# Patient Record
Sex: Female | Born: 2000 | Race: Black or African American | Hispanic: No | Marital: Single | Smoking: Former smoker
Health system: Southern US, Community
[De-identification: ages and names within clinical notes are randomized; demographics above are authoritative.]

## PROBLEM LIST (undated history)

## (undated) DIAGNOSIS — F32A Depression, unspecified: Secondary | ICD-10-CM

## (undated) DIAGNOSIS — F329 Major depressive disorder, single episode, unspecified: Secondary | ICD-10-CM

## (undated) DIAGNOSIS — F419 Anxiety disorder, unspecified: Secondary | ICD-10-CM

## (undated) DIAGNOSIS — O039 Complete or unspecified spontaneous abortion without complication: Secondary | ICD-10-CM

## (undated) DIAGNOSIS — F319 Bipolar disorder, unspecified: Secondary | ICD-10-CM

## (undated) DIAGNOSIS — J45909 Unspecified asthma, uncomplicated: Secondary | ICD-10-CM

## (undated) DIAGNOSIS — M419 Scoliosis, unspecified: Secondary | ICD-10-CM

---
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
## 2019-01-28 ENCOUNTER — Emergency Department (HOSPITAL_BASED_OUTPATIENT_CLINIC_OR_DEPARTMENT_OTHER)
Admission: EM | Admit: 2019-01-28 | Discharge: 2019-01-28 | Disposition: A | Discharge status: Home / Self Care | Attending: Emergency Medicine | Admitting: Emergency Medicine

## 2019-01-28 ENCOUNTER — Emergency Department (HOSPITAL_BASED_OUTPATIENT_CLINIC_OR_DEPARTMENT_OTHER)

## 2019-01-28 ENCOUNTER — Other Ambulatory Visit: Payer: Self-pay

## 2019-01-28 ENCOUNTER — Encounter (HOSPITAL_BASED_OUTPATIENT_CLINIC_OR_DEPARTMENT_OTHER): Payer: Self-pay | Admitting: Emergency Medicine

## 2019-01-28 DIAGNOSIS — F17200 Nicotine dependence, unspecified, uncomplicated: Secondary | ICD-10-CM | POA: Diagnosis not present

## 2019-01-28 DIAGNOSIS — O209 Hemorrhage in early pregnancy, unspecified: Secondary | ICD-10-CM | POA: Diagnosis not present

## 2019-01-28 DIAGNOSIS — O99331 Smoking (tobacco) complicating pregnancy, first trimester: Secondary | ICD-10-CM | POA: Insufficient documentation

## 2019-01-28 DIAGNOSIS — O219 Vomiting of pregnancy, unspecified: Secondary | ICD-10-CM | POA: Insufficient documentation

## 2019-01-28 DIAGNOSIS — Z3A01 Less than 8 weeks gestation of pregnancy: Secondary | ICD-10-CM | POA: Insufficient documentation

## 2019-01-28 DIAGNOSIS — O200 Threatened abortion: Secondary | ICD-10-CM

## 2019-01-28 DIAGNOSIS — Z8759 Personal history of other complications of pregnancy, childbirth and the puerperium: Secondary | ICD-10-CM | POA: Insufficient documentation

## 2019-01-28 DIAGNOSIS — J45909 Unspecified asthma, uncomplicated: Secondary | ICD-10-CM | POA: Insufficient documentation

## 2019-01-28 DIAGNOSIS — O26891 Other specified pregnancy related conditions, first trimester: Secondary | ICD-10-CM | POA: Diagnosis not present

## 2019-01-28 DIAGNOSIS — R1030 Lower abdominal pain, unspecified: Secondary | ICD-10-CM | POA: Diagnosis not present

## 2019-01-28 HISTORY — DX: Scoliosis, unspecified: M41.9

## 2019-01-28 HISTORY — DX: Anxiety disorder, unspecified: F41.9

## 2019-01-28 HISTORY — DX: Complete or unspecified spontaneous abortion without complication: O03.9

## 2019-01-28 HISTORY — DX: Bipolar disorder, unspecified: F31.9

## 2019-01-28 HISTORY — DX: Unspecified asthma, uncomplicated: J45.909

## 2019-01-28 HISTORY — DX: Depression, unspecified: F32.A

## 2019-01-28 LAB — WET PREP, GENITAL
Clue Cells Wet Prep HPF POC: NONE SEEN
Sperm: NONE SEEN
Trich, Wet Prep: NONE SEEN
Yeast Wet Prep HPF POC: NONE SEEN

## 2019-01-28 LAB — CBC WITH DIFFERENTIAL/PLATELET
Abs Immature Granulocytes: 0.02 10*3/uL (ref 0.00–0.07)
Basophils Absolute: 0 10*3/uL (ref 0.0–0.1)
Basophils Relative: 1 %
Eosinophils Absolute: 0.4 10*3/uL (ref 0.0–0.5)
Eosinophils Relative: 6 %
HCT: 37.6 % (ref 36.0–46.0)
Hemoglobin: 12 g/dL (ref 12.0–15.0)
Immature Granulocytes: 0 %
Lymphocytes Relative: 30 %
Lymphs Abs: 2.1 10*3/uL (ref 0.7–4.0)
MCH: 30.5 pg (ref 26.0–34.0)
MCHC: 31.9 g/dL (ref 30.0–36.0)
MCV: 95.4 fL (ref 80.0–100.0)
Monocytes Absolute: 0.5 10*3/uL (ref 0.1–1.0)
Monocytes Relative: 7 %
Neutro Abs: 3.8 10*3/uL (ref 1.7–7.7)
Neutrophils Relative %: 56 %
Platelets: 281 10*3/uL (ref 150–400)
RBC: 3.94 MIL/uL (ref 3.87–5.11)
RDW: 12.4 % (ref 11.5–15.5)
WBC: 6.8 10*3/uL (ref 4.0–10.5)
nRBC: 0 % (ref 0.0–0.2)

## 2019-01-28 LAB — COMPREHENSIVE METABOLIC PANEL
ALT: 12 U/L (ref 0–44)
AST: 13 U/L — ABNORMAL LOW (ref 15–41)
Albumin: 3.7 g/dL (ref 3.5–5.0)
Alkaline Phosphatase: 54 U/L (ref 38–126)
Anion gap: 8 (ref 5–15)
BUN: 14 mg/dL (ref 6–20)
CO2: 24 mmol/L (ref 22–32)
Calcium: 8.7 mg/dL — ABNORMAL LOW (ref 8.9–10.3)
Chloride: 103 mmol/L (ref 98–111)
Creatinine, Ser: 0.7 mg/dL (ref 0.44–1.00)
GFR calc Af Amer: >60 mL/min (ref >60)
GFR calc non Af Amer: >60 mL/min (ref >60)
Glucose, Bld: 88 mg/dL (ref 70–99)
Potassium: 3.8 mmol/L (ref 3.5–5.1)
Sodium: 135 mmol/L (ref 135–145)
Total Bilirubin: 0.5 mg/dL (ref 0.3–1.2)
Total Protein: 6.9 g/dL (ref 6.5–8.1)

## 2019-01-28 LAB — URINALYSIS, ROUTINE W REFLEX MICROSCOPIC
Bilirubin Urine: NEGATIVE
Glucose, UA: NEGATIVE mg/dL
Hgb urine dipstick: NEGATIVE
Ketones, ur: NEGATIVE mg/dL
Leukocytes,Ua: NEGATIVE
Nitrite: NEGATIVE
Protein, ur: NEGATIVE mg/dL
Specific Gravity, Urine: 1.02 (ref 1.005–1.030)
pH: 7.5 (ref 5.0–8.0)

## 2019-01-28 LAB — LIPASE, BLOOD: Lipase: 45 U/L (ref 11–51)

## 2019-01-28 LAB — PREGNANCY, URINE: Preg Test, Ur: POSITIVE — AB

## 2019-01-28 LAB — HCG, QUANTITATIVE, PREGNANCY: hCG, Beta Chain, Quant, S: 29109 m[IU]/mL — ABNORMAL HIGH (ref <5)

## 2019-01-28 NOTE — ED Provider Notes (Signed)
[facility] EMERGENCY DEPARTMENT Provider Note   CSN: [id number] Arrival date & time: 01/28/19  1827    History   Chief Complaint Chief Complaint  Patient presents with  . Abdominal Cramping    HPI Kristina Bean is a 18 y.o. female [id number] who presents to the ED today complaining of gradual onset, constant, achy/cramping, diffuse lower abdominal pain x 1 week. Pt states that she took 2 at home pregnancy tests last week which were positive. Pt reports the pain worsened last night and she began having nausea and vomiting. During episodes of severe pain she felt chilly but denies subjective fevers. When she woke up this morning she noticed vaginal spotting and was concerned given hx of miscarriage in the past. Pt has not been taking anything for the pain as she forgot what she could take during pregnancy. She also endorses urinary frequency and diarrhea. Denies pelvic pain, vaginal discharge, flank pain, or any other associated symptoms.        Past Medical History:  Diagnosis Date  . Anxiety   . Asthma   . Bipolar 1 disorder (HCC)   . Depression   . Miscarriage   . Scoliosis     There are no active problems to display for this patient.   History reviewed. No pertinent surgical history.   OB History    Gravida  1   Para      Term      Preterm      AB      Living        SAB      TAB      Ectopic      Multiple      Live Births               Home Medications    Prior to Admission medications   Not on File    Family History No family history on file.  Social History Social History   Tobacco Use  . Smoking status: Current Some Day Smoker  . Smokeless tobacco: Former NeurosurgeonUser  . Tobacco comment: 1 cigarette every few weeks  Substance Use Topics  . Alcohol use: Never    Frequency: Never  . Drug use: Never     Allergies   Patient has no known allergies.   Review of Systems Review of Systems  Constitutional: Positive for chills.  Negative for fever.  HENT: Negative for congestion.   Eyes: Negative for visual disturbance.  Respiratory: Negative for cough and shortness of breath.   Cardiovascular: Negative for chest pain.  Gastrointestinal: Positive for abdominal pain, diarrhea, nausea and vomiting. Negative for constipation.  Genitourinary: Positive for frequency and vaginal bleeding. Negative for dysuria, flank pain, vaginal discharge and vaginal pain.  Musculoskeletal: Negative for myalgias.  Skin: Negative for rash.  Neurological: Negative for syncope and headaches.     Physical Exam Updated Vital Signs BP 124/73 (BP Location: Right Arm)   Pulse 80   Temp 98.4 F (36.9 C) (Oral)   Resp 16   Ht 5\' 7"  (1.702 m)   Wt 68 kg   LMP 12/15/2018   SpO2 97%   BMI 23.49 kg/m   Physical Exam Vitals signs and nursing note reviewed.  Constitutional:      Appearance: She is not ill-appearing.  HENT:     Head: Normocephalic and atraumatic.  Eyes:     Conjunctiva/sclera: Conjunctivae normal.  Neck:     Musculoskeletal: Neck supple.  Cardiovascular:  Rate and Rhythm: Normal rate and regular rhythm.  Pulmonary:     Effort: Pulmonary effort is normal.     Breath sounds: Normal breath sounds.  Abdominal:     Palpations: Abdomen is soft.     Tenderness: There is abdominal tenderness. There is no guarding or rebound.     Comments: Soft, diffuse tenderness to lower abdomen, +BS throughout, no r/g/r, neg murphy's, neg mcburney's, no CVA TTP   Genitourinary:    Comments: Chaperone present for exam. No rashes, lesions, or tenderness to external genitalia. No erythema, injury, or tenderness to vaginal mucosa. No vaginal discharge or bleeding within vaginal vault. No adnexal masses, tenderness, or fullness. No CMT, cervical friability, or discharge from cervical os. Cervical os is closed. Uterus non-deviated, mobile, nonTTP, and without enlargement.   Skin:    General: Skin is warm and dry.  Neurological:      Mental Status: She is alert.      ED Treatments / Results  Labs (all labs ordered are listed, but only abnormal results are displayed) Labs Reviewed  WET PREP, GENITAL - Abnormal; Notable for the following components:      Result Value   WBC, Wet Prep HPF POC MODERATE (*)    All other components within normal limits  PREGNANCY, URINE - Abnormal; Notable for the following components:   Preg Test, Ur POSITIVE (*)    All other components within normal limits  COMPREHENSIVE METABOLIC PANEL - Abnormal; Notable for the following components:   Calcium 8.7 (*)    AST 13 (*)    All other components within normal limits  HCG, QUANTITATIVE, PREGNANCY - Abnormal; Notable for the following components:   hCG, Beta Chain, Quant, S 29,109 (*)    All other components within normal limits  URINALYSIS, ROUTINE W REFLEX MICROSCOPIC  LIPASE, BLOOD  CBC WITH DIFFERENTIAL/PLATELET  RPR  HIV ANTIBODY (ROUTINE TESTING W REFLEX)  ABO/RH  GC/CHLAMYDIA PROBE AMP ([place]) NOT AT [facility]    EKG None  Radiology US Ob Comp < 14 Wks  Result Date: 01/28/2019 CLINICAL DATA:  Vaginal spotting. Intermittent cramping for the past week. Estimated gestational age of [redacted] weeks, 2 days. EXAM: OBSTETRIC <14 WK [place] AND TRANSVAGINAL OB US TECHNIQUE: Both transabdominal and transvaginal ultrasound examinations were performed for complete evaluation of the gestation as well as the maternal uterus, adnexal regions, and pelvic cul-de-sac. Transvaginal technique was performed to assess early pregnancy. COMPARISON:  None. FINDINGS: Intrauterine gestational sac: Single Yolk sac:  Visualized. Embryo:  Not Visualized. MSD: 12.1 mm   6 w   0  d Subchorionic hemorrhage:  None visualized. Maternal uterus/adnexae: Unremarkable.  Right corpus luteum. Small free fluid in the pelvis, likely physiologic. IMPRESSION: 1. Early intrauterine gestational sac with a yolk sac, but no fetal pole or cardiac activity yet visualized. Recommend  follow-up quantitative B-HCG levels and follow-up US in 14 days to assess viability. This recommendation follows SRU consensus guidelines: Diagnostic Criteria for Nonviable Pregnancy Early in the First Trimester. [place] Cor[place] Med 2013; 361:4431-54. Electronically Signed   By: Titus Dubin M.D.   On: 01/28/2019 20:19   US Ob Transvaginal  Result Date: 01/28/2019 CLINICAL DATA:  Vaginal spotting. Intermittent cramping for the past week. Estimated gestational age of [redacted] weeks, 2 days. EXAM: OBSTETRIC <14 WK [place] AND TRANSVAGINAL OB US TECHNIQUE: Both transabdominal and transvaginal ultrasound examinations were performed for complete evaluation of the gestation as well as the maternal uterus, adnexal regions, and pelvic cul-de-sac. Transvaginal  technique was performed to assess early pregnancy. COMPARISON:  None. FINDINGS: Intrauterine gestational sac: Single Yolk sac:  Visualized. Embryo:  Not Visualized. MSD: 12.1 mm   6 w   0  d Subchorionic hemorrhage:  None visualized. Maternal uterus/adnexae: Unremarkable.  Right corpus luteum. Small free fluid in the pelvis, likely physiologic. IMPRESSION: 1. Early intrauterine gestational sac with a yolk sac, but no fetal pole or cardiac activity yet visualized. Recommend follow-up quantitative B-HCG levels and follow-up US in 14 days to assess viability. This recommendation follows SRU consensus guidelines: Diagnostic Criteria for Nonviable Pregnancy Early in the First Trimester. Malva Limes Engl J Med 2013; 161:0960-45; 369:1443-51. Electronically Signed   By: Obie DredgeWilliam T Derry M.D.   On: 01/28/2019 20:19    Procedures Procedures (including critical care time)  Medications Ordered in ED Medications - No data to display   Initial Impression / Assessment and Plan / ED Course  I have reviewed the triage vital signs and the nursing notes.  Pertinent labs & imaging results that were available during my care of the patient were reviewed by me and considered in my medical decision making (see  chart for details).    18 year old female with 2 positive at home preg tests last week who presents with abd pain, nausea, vomiting, diarrhea, and vaginal spotting. Hx of miscarriage in the past. She has diffuse lower abdominal ttp without rebound or guarding. Pt afebrile in the ED today without tachycardia or tachypnea. Concern for ectopic pregnancy giving vaginal spotting and hx of miscarriage in the past. Will obtain pelvic ultrasound today as well as perform pelvic exam. Baseline bloodwork also obtained given abd pain with N/V/D. This could be due to pregnancy or likely more so related to gastroenteritis. Pt reports she ate chicken salad from [employer] but someone else in her household ate it without symptoms. No obvious RLQ abdominal tenderness on exam; very low suspicion for acute abdomen at this time but will reassess once lab work returns.   Urine preg positive. Formal quant with reading 29,000. Remainder of bloodwork reassuring including negative wet prep and U/A. Ultrasound with 6 weeks 0 day intrauterine pregnancy without fetal heart tone. Recommend repeat ultrasound in 2 weeks to assess for viability. [facility] as she is new to the area and does not have an OBGYN. Advised to follow up this week for repeat beta hcg as well to ensure doubling. Pt is in agreement with plan at this time and stable for discharge home.        Final Clinical Impressions(s) / ED Diagnoses   Final diagnoses:  Abdominal pain during pregnancy in first trimester  Threatened abortion    ED Discharge Orders    None       Tanda RockersVenter, Aydrien Froman, PA-C 01/28/19 2138    Geoffery Lyonselo, [place], MD 01/28/19 2141

## 2019-01-28 NOTE — ED Triage Notes (Signed)
Positive pregnancy last week. LMP June 8th.  Cramping and spotting occasionally since last week for consistently for past 3 days.

## 2019-01-28 NOTE — Discharge Instructions (Signed)
You were seen in the ED today for abdominal pain Found to be pregnant at [redacted] weeks 0 days Your ultrasound showed an intrauterine pregnancy without fetal heart tone  It is recommended that you get a repeat ultrasound in 2 weeks to assess for fetal heart tone Please follow up with [facility]; they have walk in hours. You will need to call to find out when their walk in hours are. They will need to trend your beta hcg level to ensure it is doubling as expected.

## 2019-01-29 LAB — ABO/RH: ABO/RH(D): B POS

## 2019-01-30 LAB — GC/CHLAMYDIA PROBE AMP (~~LOC~~) NOT AT ARMC
Chlamydia: NEGATIVE
Neisseria Gonorrhea: NEGATIVE

## 2019-01-30 LAB — HIV ANTIBODY (ROUTINE TESTING W REFLEX): HIV Screen 4th Generation wRfx: NONREACTIVE

## 2019-01-30 LAB — SYPHILIS: RPR W/REFLEX TO RPR TITER AND TREPONEMAL ANTIBODIES, TRADITIONAL SCREENING AND DIAGNOSIS ALGORITHM: RPR Ser Ql: NONREACTIVE

## 2019-02-04 ENCOUNTER — Other Ambulatory Visit: Payer: Self-pay

## 2019-02-04 ENCOUNTER — Observation Stay (HOSPITAL_COMMUNITY): Admission: AD | Admit: 2019-02-04 | Source: Home / Self Care | Admitting: Psychiatry

## 2019-02-04 ENCOUNTER — Other Ambulatory Visit: Payer: Self-pay | Admitting: Behavioral Health

## 2019-02-04 ENCOUNTER — Emergency Department (HOSPITAL_BASED_OUTPATIENT_CLINIC_OR_DEPARTMENT_OTHER)
Admission: EM | Admit: 2019-02-04 | Discharge: 2019-02-04 | Disposition: A | Discharge status: Home / Self Care | Attending: Emergency Medicine | Admitting: Emergency Medicine

## 2019-02-04 ENCOUNTER — Emergency Department (HOSPITAL_BASED_OUTPATIENT_CLINIC_OR_DEPARTMENT_OTHER)

## 2019-02-04 DIAGNOSIS — F329 Major depressive disorder, single episode, unspecified: Secondary | ICD-10-CM

## 2019-02-04 DIAGNOSIS — F17210 Nicotine dependence, cigarettes, uncomplicated: Secondary | ICD-10-CM | POA: Diagnosis not present

## 2019-02-04 DIAGNOSIS — F32A Depression, unspecified: Secondary | ICD-10-CM

## 2019-02-04 DIAGNOSIS — F419 Anxiety disorder, unspecified: Secondary | ICD-10-CM | POA: Diagnosis not present

## 2019-02-04 DIAGNOSIS — O99341 Other mental disorders complicating pregnancy, first trimester: Secondary | ICD-10-CM | POA: Diagnosis present

## 2019-02-04 DIAGNOSIS — Z3A01 Less than 8 weeks gestation of pregnancy: Secondary | ICD-10-CM | POA: Diagnosis not present

## 2019-02-04 DIAGNOSIS — Z3A08 8 weeks gestation of pregnancy: Secondary | ICD-10-CM

## 2019-02-04 DIAGNOSIS — O200 Threatened abortion: Secondary | ICD-10-CM

## 2019-02-04 DIAGNOSIS — O3680X0 Pregnancy with inconclusive fetal viability, not applicable or unspecified: Secondary | ICD-10-CM

## 2019-02-04 NOTE — ED Notes (Signed)
[place] call in progress with patient

## 2019-02-04 NOTE — ED Notes (Signed)
Pt provided snack at this time. 

## 2019-02-04 NOTE — ED Notes (Signed)
[place] provided resource information to [facility]. Information provided to pt and educated. Pt voiced understanding and is ok with this plan.

## 2019-02-04 NOTE — ED Notes (Signed)
ED Provider at bedside. 

## 2019-02-04 NOTE — ED Notes (Signed)
Pt voiced feeling safe to go home- pt informed RN she would call family services today to make an appt. Pt encouraged to come back or call [facility] hotline if needed.

## 2019-02-04 NOTE — ED Notes (Signed)
Spoke with Crystal at [facility]. Psych eval cart at bedside for consult.

## 2019-02-04 NOTE — ED Notes (Signed)
Patient transported to US 

## 2019-02-04 NOTE — ED Notes (Signed)
Received call from [place] at [facility] regarding what she needs to get this pt transferred there for overnight Obs. 1 - Covid Test.  2 - EDP signed medical clearance. 3 - Address the pregnancy note from 1 week ago that said "No FHT".  Dr. Stark Jock notified.  Orders being placed.

## 2019-02-04 NOTE — Progress Notes (Signed)
Spoke with patient's RN, Orlando Penner, who notes that patient is reluctant to be brought over to the [facility] Observation unit as her partner is unable to get her car and she was really looking for outpatient services.  From the record, it does not appear that patient would meet any criteria for being IVC'd.  CSW advised that the decision to d/c patient was the ED Provider's to make.    CSW entered Follow-up provider information for [facility] [place] into patient's discharge instructions and notified ED RN of same.  Areatha Keas. Judi Cong, MSW, [place] Disposition Clinical Social Work [phone] (cell) [phone] (office)
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
## 2019-02-04 NOTE — Progress Notes (Signed)
Pt accepted to [facility] Observation unit, Bed 206-1  Kristina Maes, NP is the accepting provider.  Kristina Abbot, MD is the attending provider.  Call report to [phone]  @ [facility] ED notified.   Pt is Voluntary.  Pt may be transported by [facility]  Pt scheduled  to arrive at [facility] Observation unit once patient has a resulted COVID-19 test and is medically cleared by ED Provider, and transport can be arranged.  Kristina Bean. Judi Cong, MSW, [place] Disposition Clinical Social Work [phone] (cell) [phone] (office)

## 2019-02-04 NOTE — ED Triage Notes (Signed)
Pt c/o anxiety and panic attacks. Pt verbalizes hx of anxiety. Pt tearful during triage.

## 2019-02-04 NOTE — ED Provider Notes (Signed)
[facility] EMERGENCY DEPARTMENT Provider Note   CSN: [id number] Arrival date & time: 02/04/19  [id number]     History   Chief Complaint Chief Complaint  Patient presents with  . Anxiety    HPI Emireth Fayrene FearingJames is a 18 y.o. female.     Patient is a 18 year old female with history of anxiety, depression, bipolar.  She is currently pregnant in the first trimester.  She presents today for evaluation of anxiety, depression, tearfulness.  Patient describes being pregnant and having little support from her boyfriend.  She tells me that he is encouraging her to have a miscarriage which she does not want to have.  Patient also reports multiple stressors including finances and education contributing to her visit today.  She tells me that she has had some suicidal ideation, however would never act on these impulses due to her young daughter needing her.  Patient is requesting to speak with a psychiatrist.  Patient has been in this area for approximately 2 months.  She relocated here from [place].  Patient states that she has no other support in her life.  Her mother died from an apparent homicide several years ago and she is no longer on speaking terms with her father.  The history is provided by the patient.  Anxiety This is a recurrent problem. The current episode started 2 days ago. The problem occurs constantly. The problem has been rapidly worsening. Associated symptoms include chest pain. Nothing aggravates the symptoms. Nothing relieves the symptoms. She has tried nothing for the symptoms.    Past Medical History:  Diagnosis Date  . Anxiety   . Asthma   . Bipolar 1 disorder (HCC)   . Depression   . Miscarriage   . Scoliosis     There are no active problems to display for this patient.   No past surgical history on file.   OB History    Gravida  1   Para      Term      Preterm      AB      Living        SAB      TAB      Ectopic      Multiple      Live  Births               Home Medications    Prior to Admission medications   Not on File    Family History No family history on file.  Social History Social History   Tobacco Use  . Smoking status: Current Some Day Smoker  . Smokeless tobacco: Former NeurosurgeonUser  . Tobacco comment: 1 cigarette every few weeks  Substance Use Topics  . Alcohol use: Never    Frequency: Never  . Drug use: Never     Allergies   Patient has no known allergies.   Review of Systems Review of Systems  Cardiovascular: Positive for chest pain.  All other systems reviewed and are negative.    Physical Exam Updated Vital Signs BP (!) 146/92 (BP Location: Right Arm)   Pulse 92   Temp 98.3 F (36.8 C) (Oral)   Resp 20   Ht 5\' 7"  (1.702 m)   Wt 68 kg   LMP 12/15/2018   SpO2 96%   BMI 23.49 kg/m   Physical Exam Vitals signs and nursing note reviewed.  Constitutional:      General: She is not in acute distress.    Appearance:  She is well-developed. She is not diaphoretic.  HENT:     Head: Normocephalic and atraumatic.  Neck:     Musculoskeletal: Normal range of motion and neck supple.  Cardiovascular:     Rate and Rhythm: Normal rate and regular rhythm.     Heart sounds: No murmur. No friction rub. No gallop.   Pulmonary:     Effort: Pulmonary effort is normal. No respiratory distress.     Breath sounds: Normal breath sounds. No wheezing.  Abdominal:     General: Bowel sounds are normal. There is no distension.     Palpations: Abdomen is soft.     Tenderness: There is no abdominal tenderness.  Musculoskeletal: Normal range of motion.  Skin:    General: Skin is warm and dry.  Neurological:     Mental Status: She is alert and oriented to person, place, and time.  Psychiatric:        Attention and Perception: Attention and perception normal.        Mood and Affect: Mood is anxious. Affect is tearful.        Speech: Speech is rapid and pressured.        Thought Content: Thought  content does not include homicidal or suicidal ideation. Thought content does not include homicidal or suicidal plan.        Cognition and Memory: Cognition normal.        Judgment: Judgment normal.      ED Treatments / Results  Labs (all labs ordered are listed, but only abnormal results are displayed) Labs Reviewed - No data to display  EKG None  Radiology No results found.  Procedures Procedures (including critical care time)  Medications Ordered in ED Medications - No data to display   Initial Impression / Assessment and Plan / ED Course  I have reviewed the triage vital signs and the nursing notes.  Pertinent labs & imaging results that were available during my care of the patient were reviewed by me and considered in my medical decision making (see chart for details).  Patient is a 18 year old female presenting with complaints of anxiety and panic attacks.  Patient has excessive stressors in her life including caring for her young daughter and being pregnant.  She tells me that her significant other is unsupportive of this pregnancy and encouraging her to have an abortion.  She is very tearful and upset about this.  Patient requested to talk to a counselor.  She was put in touch with [facility] who has spoken with the patient.  They were offering overnight observation.  Patient was initially receptive to this, however as her ER stay went on, she realized she had too many responsibilities at home with her daughter and significant other.  She ultimately declined this offer and was discharged at her request.  Patient was not suicidal or homicidal and was not hallucinating.  She has decision-making capacity.  Patient's medical work-up was essentially unremarkable.  Her ultrasound performed today reveals a viable intrauterine pregnancy.  Final Clinical Impressions(s) / ED Diagnoses   Final diagnoses:  None    ED Discharge Orders    None       Veryl Speak, MD 02/05/19 [phone]

## 2019-02-04 NOTE — Discharge Instructions (Addendum)
Follow-up with [facility] [place] as recommended by [facility] representatives.  Return to the emergency department if you experience any new and/or concerning symptoms.

## 2019-02-04 NOTE — ED Notes (Signed)
Pt at this time doesn't feel that she can be admitted for overnight observation r/t a young child at home with pts significant other and that family member has no transportation because pt has car with her. Pt states she is from out of town and no other family to help. This RN will provide an update to Romie Minus after EDP provider reassesses patient.

## 2019-02-04 NOTE — ED Notes (Signed)
Crystal at [facility] advises recommendation for pt to be observed overnight. Crystal spoke with pat via telepsych. Pt verbalized understanding and affirms overnight stay. EDP Delo to be notified.

## 2019-04-04 ENCOUNTER — Encounter (HOSPITAL_BASED_OUTPATIENT_CLINIC_OR_DEPARTMENT_OTHER): Payer: Self-pay | Admitting: Emergency Medicine

## 2019-04-04 ENCOUNTER — Emergency Department (HOSPITAL_BASED_OUTPATIENT_CLINIC_OR_DEPARTMENT_OTHER)
Admission: EM | Admit: 2019-04-04 | Discharge: 2019-04-04 | Disposition: A | Discharge status: Home / Self Care | Attending: Emergency Medicine | Admitting: Emergency Medicine

## 2019-04-04 ENCOUNTER — Other Ambulatory Visit: Payer: Self-pay

## 2019-04-04 DIAGNOSIS — N390 Urinary tract infection, site not specified: Secondary | ICD-10-CM | POA: Diagnosis not present

## 2019-04-04 DIAGNOSIS — Z720 Tobacco use: Secondary | ICD-10-CM | POA: Insufficient documentation

## 2019-04-04 DIAGNOSIS — J45909 Unspecified asthma, uncomplicated: Secondary | ICD-10-CM | POA: Diagnosis not present

## 2019-04-04 DIAGNOSIS — J329 Chronic sinusitis, unspecified: Secondary | ICD-10-CM | POA: Diagnosis not present

## 2019-04-04 DIAGNOSIS — R300 Dysuria: Secondary | ICD-10-CM | POA: Diagnosis present

## 2019-04-04 LAB — URINALYSIS, ROUTINE W REFLEX MICROSCOPIC
Bilirubin Urine: NEGATIVE
Glucose, UA: NEGATIVE mg/dL
Ketones, ur: NEGATIVE mg/dL
Nitrite: NEGATIVE
Protein, ur: NEGATIVE mg/dL
Specific Gravity, Urine: 1.025 (ref 1.005–1.030)
pH: 6 (ref 5.0–8.0)

## 2019-04-04 LAB — URINALYSIS, MICROSCOPIC (REFLEX)

## 2019-04-04 LAB — PREGNANCY, URINE: Preg Test, Ur: NEGATIVE

## 2019-04-04 MED ORDER — CEPHALEXIN 500 MG PO CAPS: 500.0000 mg | ORAL_CAPSULE | Freq: Four times a day (QID) | ORAL | 0 refills | Status: AC

## 2019-04-04 NOTE — Discharge Instructions (Signed)
If you develop fever, vomiting, shortness of breath, or any other new/concerning symptoms, then return to the ER

## 2019-04-04 NOTE — ED Provider Notes (Signed)
[facility] EMERGENCY DEPARTMENT Provider Note   CSN: [id number] Arrival date & time: 04/04/19  1531     History   Chief Complaint Chief Complaint  Patient presents with  . Nasal Congestion  . Dysuria    HPI Kristina Bean is a 18 y.o. female.     HPI 18 year old female presents with 2 complaints.  Chief complaint is dysuria and bilateral flank pain concerning for recurrent UTI.  Has had this multiple times.  Typically does not get the back pain.  Started about 6 days ago with the dysuria and frequency and then 5 days of the mild flank pain bilaterally.  Some blood in her urine on and off.  No fevers.  She also complains of sinusitis/sinus congestion for about 6 months.  Has been on 2 different antibiotics.  Has been trying different antihistamine such as Benadryl and Zyrtec as well as Flonase.  Nothing seems to help.  A lot of mucus that runs down her throat and some green and yellow and clear sputum.  No shortness of breath.  No fevers.  Sometimes the mucus will make her throw up but otherwise no vomiting.  No concern for STI or vaginal discharge/bleeding.  Past Medical History:  Diagnosis Date  . Anxiety   . Asthma   . Bipolar 1 disorder ([place])   . Depression   . Miscarriage   . Scoliosis     There are no active problems to display for this patient.   History reviewed. No pertinent surgical history.   OB History    Gravida  1   Para      Term      Preterm      AB      Living        SAB      TAB      Ectopic      Multiple      Live Births               Home Medications    Prior to Admission medications   Medication Sig Start Date End Date Taking? Authorizing Provider  cephALEXin (KEFLEX) 500 MG capsule Take 1 capsule (500 mg total) by mouth 4 (four) times daily for 10 days. 04/04/19 04/14/19  Sherwood Gambler, MD    Family History No family history on file.  Social History Social History   Tobacco Use  . Smoking status: Current  Some Day Smoker  . Smokeless tobacco: Former Systems developer  . Tobacco comment: 1 cigarette every few weeks  Substance Use Topics  . Alcohol use: Never    Frequency: Never  . Drug use: Never     Allergies   Patient has no known allergies.   Review of Systems Review of Systems  Constitutional: Negative for fever.  HENT: Positive for congestion and postnasal drip.   Respiratory: Negative for shortness of breath.   Gastrointestinal: Negative for abdominal pain and vomiting.  Genitourinary: Positive for dysuria, flank pain, frequency, hematuria and urgency. Negative for vaginal bleeding and vaginal discharge.  All other systems reviewed and are negative.    Physical Exam Updated Vital Signs BP (!) 143/97 (BP Location: Left Arm)   Pulse 64   Temp 97.8 F (36.6 C) (Oral)   Resp 18   Ht 5\' 7"  (1.702 m)   Wt 68 kg   LMP 04/04/2019   SpO2 100%   Breastfeeding Unknown   BMI 23.49 kg/m   Physical Exam Vitals signs and nursing note  reviewed.  Constitutional:      Appearance: She is well-developed.  HENT:     Head: Normocephalic and atraumatic.     Right Ear: External ear normal.     Left Ear: External ear normal.     Nose: Nose normal.     Mouth/Throat:     Mouth: Mucous membranes are moist.     Pharynx: No oropharyngeal exudate or posterior oropharyngeal erythema.  Eyes:     General:        Right eye: No discharge.        Left eye: No discharge.  Cardiovascular:     Rate and Rhythm: Normal rate and regular rhythm.     Heart sounds: Normal heart sounds.  Pulmonary:     Effort: Pulmonary effort is normal.     Breath sounds: Normal breath sounds.  Abdominal:     Palpations: Abdomen is soft.     Tenderness: There is no abdominal tenderness. There is right CVA tenderness and left CVA tenderness.     Comments: Mild bilateral flank tenderness  Skin:    General: Skin is warm and dry.  Neurological:     Mental Status: She is alert.  Psychiatric:        Mood and Affect: Mood  is not anxious.      ED Treatments / Results  Labs (all labs ordered are listed, but only abnormal results are displayed) Labs Reviewed  URINALYSIS, ROUTINE W REFLEX MICROSCOPIC - Abnormal; Notable for the following components:      Result Value   APPearance CLOUDY (*)    Hgb urine dipstick SMALL (*)    Leukocytes,Ua TRACE (*)    All other components within normal limits  URINALYSIS, MICROSCOPIC (REFLEX) - Abnormal; Notable for the following components:   Bacteria, UA MANY (*)    All other components within normal limits  URINE CULTURE  PREGNANCY, URINE    EKG None  Radiology No results found.  Procedures Procedures (including critical care time)  Medications Ordered in ED Medications - No data to display   Initial Impression / Assessment and Plan / ED Course  I have reviewed the triage vital signs and the nursing notes.  Pertinent labs & imaging results that were available during my care of the patient were reviewed by me and considered in my medical decision making (see chart for details).        Per the patient's sinusitis problems, I do not think antibiotics are warranted given the length of time of her symptoms and previous nonresponse to antibiotics along with no fever.  I will refer her to ENT.  As for her urinary symptoms, appears to be a urinary tract infection and with some mild flank pain, will treat as early pyelonephritis.  I will give her Keflex for this and send urine for culture.  Return precautions discussed.  Final Clinical Impressions(s) / ED Diagnoses   Final diagnoses:  Acute urinary tract infection  Chronic sinusitis, unspecified location    ED Discharge Orders         Ordered    cephALEXin (KEFLEX) 500 MG capsule  4 times daily     04/04/19 1801           Pricilla Loveless, MD 04/04/19 Paulo Fruit

## 2019-04-04 NOTE — ED Notes (Signed)
ED Provider at bedside. 

## 2019-04-04 NOTE — ED Triage Notes (Signed)
Patient states that she has had a sinus infection since March - she reports that she continues to have problems with her breathing and nasal congestion after multiple treatments. The patient also reports that 2 days ago she started to have burning with urination, flank pain and bladder spasms

## 2019-04-07 LAB — URINE CULTURE

## 2020-06-04 ENCOUNTER — Telehealth: Admitting: Emergency Medicine

## 2020-06-04 DIAGNOSIS — R300 Dysuria: Secondary | ICD-10-CM | POA: Diagnosis not present

## 2020-06-04 MED ORDER — NITROFURANTOIN MONOHYD MACRO 100 MG PO CAPS: 100.0000 mg | ORAL_CAPSULE | Freq: Two times a day (BID) | ORAL | 0 refills | Status: DC

## 2020-06-04 NOTE — Progress Notes (Signed)
We are sorry that you are not feeling well.  Here is how we plan to help!  Based on what you shared with me it looks like you most likely have a simple urinary tract infection.  A UTI (Urinary Tract Infection) is a bacterial infection of the bladder.  Most cases of urinary tract infections are simple to treat but a key part of your care is to encourage you to drink plenty of fluids and watch your symptoms carefully.  I have prescribed MacroBid 100 mg twice a day for 5 days.  Your symptoms should gradually improve. Call us if the burning in your urine worsens, you develop worsening fever, back pain or pelvic pain or if your symptoms do not resolve after completing the antibiotic.  Urinary tract infections can be prevented by drinking plenty of water to keep your body hydrated.  Also be sure when you wipe, wipe from front to back and don't hold it in!  If possible, empty your bladder every 4 hours.  Your e-visit answers were reviewed by a board certified advanced clinical practitioner to complete your personal care plan.  Depending on the condition, your plan could have included both over the counter or prescription medications.  If there is a problem please reply  once you have received a response from your provider.  Your safety is important to us.  If you have drug allergies check your prescription carefully.    You can use MyChart to ask questions about today's visit, request a non-urgent call back, or ask for a work or school excuse for 24 hours related to this e-Visit. If it has been greater than 24 hours you will need to follow up with your provider, or enter a new e-Visit to address those concerns.   You will get an e-mail in the next two days asking about your experience.  I hope that your e-visit has been valuable and will speed your recovery. Thank you for using e-visits.   **Please do not respond to this message unless you have follow up questions.** Greater than 5 but less than 10  minutes spent researching, coordinating, and implementing care for this patient today  

## 2020-06-11 ENCOUNTER — Telehealth: Admitting: Nurse Practitioner

## 2020-06-11 DIAGNOSIS — R399 Unspecified symptoms and signs involving the genitourinary system: Secondary | ICD-10-CM

## 2020-06-11 NOTE — Progress Notes (Signed)
Based on what you shared with me it looks like you have unresolved UTI. ,that should be evaluated in a face to face office visit. Because previous treatment did not completely tae care of your symptoms, you need a urinalysis and urine culture for proper treament.    NOTE: If you entered your credit card information for this eVisit, you will not be charged. You may see a "hold" on your card for the $35 but that hold will drop off and you will not have a charge processed.  If you are having a true medical emergency please call 911.     For an urgent face to face visit, [facility] has four urgent care centers for your convenience:   . [facility]    [phone]                  Get Driving Directions  [address] [place], [place] [address] . 10 am to 8 pm Monday-Friday . 12 pm to 8 pm Saturday-Sunday   . [facility] at [facility]  [phone]                  Get Driving Directions  [address] [place], [place] [address] . 8 am to 8 pm Monday-Friday . 9 am to 6 pm Saturday . 11 am to 6 pm Sunday   . [facility] at [facility]  [phone]                  Get Driving Directions   [address] [place], [place] [address] . 8 am to 8 pm Monday-Friday . 8 am to 4 pm Saturday-Sunday    . [facility] at [facility] Directions  [phone]  [address] [place], [place] [address]  . Monday-Friday, 12 PM to 6 PM    Your e-visit answers were reviewed by a board certified advanced clinical practitioner to complete your personal care plan.  Thank you for using e-Visits.

## 2020-06-26 ENCOUNTER — Emergency Department (HOSPITAL_COMMUNITY)
Admission: EM | Admit: 2020-06-26 | Discharge: 2020-06-26 | Disposition: A | Discharge status: Home / Self Care | Attending: Emergency Medicine | Admitting: Emergency Medicine

## 2020-06-26 ENCOUNTER — Other Ambulatory Visit: Payer: Self-pay

## 2020-06-26 ENCOUNTER — Emergency Department (HOSPITAL_COMMUNITY)

## 2020-06-26 ENCOUNTER — Encounter (HOSPITAL_COMMUNITY): Payer: Self-pay | Admitting: Emergency Medicine

## 2020-06-26 DIAGNOSIS — M545 Low back pain, unspecified: Secondary | ICD-10-CM | POA: Insufficient documentation

## 2020-06-26 DIAGNOSIS — F17200 Nicotine dependence, unspecified, uncomplicated: Secondary | ICD-10-CM | POA: Diagnosis not present

## 2020-06-26 DIAGNOSIS — R200 Anesthesia of skin: Secondary | ICD-10-CM | POA: Insufficient documentation

## 2020-06-26 DIAGNOSIS — J45909 Unspecified asthma, uncomplicated: Secondary | ICD-10-CM | POA: Insufficient documentation

## 2020-06-26 LAB — BASIC METABOLIC PANEL
Anion gap: 11 (ref 5–15)
BUN: 18 mg/dL (ref 6–20)
CO2: 23 mmol/L (ref 22–32)
Calcium: 9.6 mg/dL (ref 8.9–10.3)
Chloride: 106 mmol/L (ref 98–111)
Creatinine, Ser: 0.85 mg/dL (ref 0.44–1.00)
GFR, Estimated: >60 mL/min (ref >60)
Glucose, Bld: 88 mg/dL (ref 70–99)
Potassium: 3.7 mmol/L (ref 3.5–5.1)
Sodium: 140 mmol/L (ref 135–145)

## 2020-06-26 LAB — CBC
HCT: 42 % (ref 36.0–46.0)
Hemoglobin: 13.8 g/dL (ref 12.0–15.0)
MCH: 31.1 pg (ref 26.0–34.0)
MCHC: 32.9 g/dL (ref 30.0–36.0)
MCV: 94.6 fL (ref 80.0–100.0)
Platelets: 293 10*3/uL (ref 150–400)
RBC: 4.44 MIL/uL (ref 3.87–5.11)
RDW: 11.7 % (ref 11.5–15.5)
WBC: 4.5 10*3/uL (ref 4.0–10.5)
nRBC: 0 % (ref 0.0–0.2)

## 2020-06-26 LAB — I-STAT BETA HCG BLOOD, ED (MC, WL, AP ONLY): I-stat hCG, quantitative: <5 m[IU]/mL (ref <5)

## 2020-06-26 LAB — TROPONIN I (HIGH SENSITIVITY)
Troponin I (High Sensitivity): 7 ng/L (ref <18)
Troponin I (High Sensitivity): 6 ng/L (ref <18)

## 2020-06-26 MED ORDER — ACETAMINOPHEN 325 MG PO TABS
650.0000 mg | ORAL_TABLET | Freq: Once | ORAL | Status: AC
  Administered 2020-06-26: 650 mg via ORAL
  Filled 2020-06-26: qty 2

## 2020-06-26 MED ORDER — PREDNISONE 20 MG PO TABS: 20.0000 mg | ORAL_TABLET | Freq: Every day | ORAL | 0 refills | Status: AC

## 2020-06-26 MED ORDER — LIDOCAINE 5 % EX PTCH
1.0000 | MEDICATED_PATCH | Freq: Once | CUTANEOUS | Status: DC
  Administered 2020-06-26: 1 via TRANSDERMAL
  Filled 2020-06-26: qty 1

## 2020-06-26 MED ORDER — PREDNISONE 20 MG PO TABS
60.0000 mg | ORAL_TABLET | Freq: Once | ORAL | Status: AC
  Administered 2020-06-26: 60 mg via ORAL
  Filled 2020-06-26: qty 3

## 2020-06-26 NOTE — ED Provider Notes (Signed)
[facility] EMERGENCY DEPARTMENT Provider Note   CSN: [id number] Arrival date & time: 06/26/20  1127     History Chief Complaint  Patient presents with  . Back Pain  . Shortness of Breath    Kristina Bean is a 19 y.o. female with past medical history significant for anxiety, bipolar 1 disorder, depression, asthma, scoliosis.  HPI Presents to emergency room today with chief complaint of back pain x2 weeks.She states pain is in her middle and low back on the right side. She describes it as a burning and aching pain. Pain does not radiate. She states her pain is worse when trying to do things such as lift her two year old daughter or bend down while giving her a bath. She feels like her back pain is progressively worsening. She states pain is constant. She rates pain 7/10 in severity. She has history of similar pain and has seen a chiropractor in the past. No medications for symptoms prior to arrival.  Patient states she has been having bilateral leg numbness x 1 week. It is intermittent. She states it happens when she is sitting for long periods of time. It feels like pins and needles. Typically she is able to get up and walk and the symptoms improve.  Last night the pain was so bad she was crying. She feels pressure in her legs. She noticed when laying flat yesterday her bilateral great toes were red. She was able to walk although states it was difficult. She also states when she has the leg numbness she will have have intermittent sharp pain across her chest and the pain in her back is worse with inspirations.   Not currently followed for her scoliosis anywhere. She has appointment scheduled with a Spine doctor 07/20/2019. It will be her first appointment at the practice. Denies any sick contacts.Denies fevers, weight loss, numbness/weakness of upper and lower extremities, bowel/bladder incontinence, urinary retention, history of cancer, saddle anesthesia, history of back  surgery, history of IVDA.       Past Medical History:  Diagnosis Date  . Anxiety   . Asthma   . Bipolar 1 disorder (HCC)   . Depression   . Miscarriage   . Scoliosis     There are no problems to display for this patient.   History reviewed. No pertinent surgical history.   OB History    Gravida  1   Para      Term      Preterm      AB      Living        SAB      IAB      Ectopic      Multiple      Live Births              No family history on file.  Social History   Tobacco Use  . Smoking status: Current Some Day Smoker  . Smokeless tobacco: Former Neurosurgeon  . Tobacco comment: 1 cigarette every few weeks  Substance Use Topics  . Alcohol use: Never  . Drug use: Never    Home Medications Prior to Admission medications   Medication Sig Start Date End Date Taking? Authorizing Provider  nitrofurantoin, macrocrystal-monohydrate, (MACROBID) 100 MG capsule Take 1 capsule (100 mg total) by mouth 2 (two) times daily. 06/04/20   Harris, Cammy Copa, PA-C  predniSONE (DELTASONE) 20 MG tablet Take 1 tablet (20 mg total) by mouth daily for 5 days. 06/27/20 07/02/20  Shanon AceWalisiewicz, Claire Dolores E, PA-C    Allergies    Patient has no known allergies.  Review of Systems   Review of Systems All other systems are reviewed and are negative for acute change except as noted in the HPI.  Physical Exam Updated Vital Signs BP 139/87 (BP Location: Left Arm)   Pulse (!) 104   Temp 99.3 F (37.4 C) (Oral)   Resp 12   Ht 5\' 7"  (1.702 m)   Wt 69.4 kg   LMP 06/10/2020   SpO2 100%   BMI 23.96 kg/m   Physical Exam Vitals and nursing note reviewed.  Constitutional:      General: She is not in acute distress.    Appearance: She is not ill-appearing.  HENT:     Head: Normocephalic and atraumatic.     Right Ear: Tympanic membrane and external ear normal.     Left Ear: Tympanic membrane and external ear normal.     Nose: Nose normal.     Mouth/Throat:     Mouth:  Mucous membranes are moist.     Pharynx: Oropharynx is clear.  Eyes:     General: No scleral icterus.       Right eye: No discharge.        Left eye: No discharge.     Extraocular Movements: Extraocular movements intact.     Conjunctiva/sclera: Conjunctivae normal.     Pupils: Pupils are equal, round, and reactive to light.  Neck:     Vascular: No JVD.  Cardiovascular:     Rate and Rhythm: Normal rate and regular rhythm.     Pulses: Normal pulses.          Radial pulses are 2+ on the right side and 2+ on the left side.       Dorsalis pedis pulses are 2+ on the right side and 2+ on the left side.     Heart sounds: Normal heart sounds.     Comments: HR in the 70s during exam Pulmonary:     Comments: Lungs clear to auscultation in all fields. Symmetric chest rise. No wheezing, rales, or rhonchi. Abdominal:     Comments: Abdomen is soft, non-distended, and non-tender in all quadrants. No rigidity, no guarding. No peritoneal signs.  Musculoskeletal:        General: Normal range of motion.     Cervical back: Normal range of motion.       Back:     Comments: Tender to palpation as depicted image above.  No overlying skin changes.  Skin:    General: Skin is warm and dry.     Capillary Refill: Capillary refill takes less than 2 seconds.  Neurological:     Mental Status: She is oriented to person, place, and time.     GCS: GCS eye subscore is 4. GCS verbal subscore is 5. GCS motor subscore is 6.     Comments: Speech is clear and goal oriented, follows commands CN III-XII intact, no facial droop Normal strength in upper and lower extremities bilaterally including dorsiflexion and plantar flexion, strong and equal grip strength No saddle anesthesia. Sensation normal to light and sharp touch Moves extremities without ataxia, coordination intact Normal finger to nose and rapid alternating movements Normal gait and balance   Psychiatric:        Behavior: Behavior normal.     ED  Results / Procedures / Treatments   Labs (all labs ordered are listed, but only abnormal results are displayed) Labs  Reviewed  BASIC METABOLIC PANEL  CBC  I-STAT BETA HCG BLOOD, ED ([facility], [facility], [facility] ONLY)  TROPONIN I (HIGH SENSITIVITY)  TROPONIN I (HIGH SENSITIVITY)    EKG EKG Interpretation  Date/Time:  Sunday June 26 2020 11:53:33 EST Ventricular Rate:  90 PR Interval:  128 QRS Duration: 82 QT Interval:  328 QTC Calculation: 401 R Axis:   79 Text Interpretation: Normal sinus rhythm Normal ECG No old tracing to compare Confirmed by Jacalyn Lefevre [phone]) on 06/26/2020 12:42:48 PM   Radiology DG Chest 2 View  Result Date: 06/26/2020 CLINICAL DATA:  Back pain in a patient with a history of scoliosis. EXAM: CHEST - 2 VIEW COMPARISON:  None. FINDINGS: Lungs clear. Heart size normal. No pneumothorax or pleural effusion. No acute or focal bony abnormality. S shaped thoracolumbar scoliosis is partially imaged. Scoliosis convex to the right in the thoracic spine with the apex at T11. IMPRESSION: No acute disease. Scoliosis. Electronically Signed   By: Drusilla Kanner M.D.   On: 06/26/2020 14:50   DG Thoracic Spine 2 View  Result Date: 06/26/2020 CLINICAL DATA:  Thoracic spine pain.  No known injury. EXAM: THORACIC SPINE 2 VIEWS COMPARISON:  None. FINDINGS: No fracture or malalignment. Intervertebral disc space height is maintained. Convex right scoliosis with the apex at T11 noted. 12 rib-bearing vertebral bodies. No segmentation anomaly. IMPRESSION: Convex right thoracolumbar scoliosis.  Otherwise negative. Electronically Signed   By: Drusilla Kanner M.D.   On: 06/26/2020 14:51   DG Lumbar Spine Complete  Result Date: 06/26/2020 CLINICAL DATA:  Low back and thoracic spine pain. No acute abnormality. EXAM: LUMBAR SPINE - COMPLETE 4+ VIEW COMPARISON:  None. FINDINGS: No fracture or listhesis. Intervertebral disc space height is maintained. Convex right thoracolumbar scoliosis with the  apex at T11 noted. The patient has 5 lumbar type vertebral bodies. No segmentation anomaly. IMPRESSION: Convex right thoracolumbar scoliosis.  Otherwise negative. Electronically Signed   By: Drusilla Kanner M.D.   On: 06/26/2020 14:51    Procedures Procedures (including critical care time)  Medications Ordered in ED Medications  lidocaine (LIDODERM) 5 % 1 patch (1 patch Transdermal Patch Applied 06/26/20 1355)  acetaminophen (TYLENOL) tablet 650 mg (650 mg Oral Given 06/26/20 1355)  predniSONE (DELTASONE) tablet 60 mg (60 mg Oral Given 06/26/20 1611)    ED Course  I have reviewed the triage vital signs and the nursing notes.  Pertinent labs & imaging results that were available during my care of the patient were reviewed by me and considered in my medical decision making (see chart for details).  Vitals:   06/26/20 1148 06/26/20 1449  BP: 139/87 128/69  Pulse: (!) 104 68  Resp: 12 14  Temp: 99.3 F (37.4 C) 98.9 F (37.2 C)  TempSrc: Oral Oral  SpO2: 100% 100%  Weight: 69.4 kg   Height: 5\' 7"  (1.702 m)       MDM Rules/Calculators/A&P                          History provided by patient with additional history obtained from chart review.    19 yo female presenting with back pain and bilateral leg numbness. Afebrile, HDS. Noted to be tachycardic in triage to 104, resolved by the time of my exam however, suspect anxiety related. Patient has multiple complaints of back pain and bilateral leg numbness. Neuro exam is normal. No focal weakness. Ambulatory with normal gait, no numbness of extremities. No red flags for back  pain. Work up initiated in triage. CBC and BMP are unremarkable. Delta troponin negative. Pregnancy test negative. EKG is NSR, no signs of ischemia. Xray of chest, thoracic spine, and lumbar spine ordered. I viewed all images. No acute findings. Radiologist comments on her scoliosis. Agree with radiologist impression.  Patient given tylenol, prednisone, and  lidoderm patch. On reassessment she states pain has significantly improved. She has been in the ED for a total of 4 hours now without any symptoms. Doubt PE, ACS or dissection as cause of her chest pain. Suspect radicular pain or pinched nerve. Recommend she keep follow up appointment with spine specialist next month. Discharged to home with short burst of prednisone and encourage tylenol and ibuprofen if needed. The patient appears reasonably screened and/or stabilized for discharge and I doubt any other medical condition or other [facility] requiring further screening, evaluation, or treatment in the ED at this time prior to discharge. The patient is safe for discharge with strict return precautions discussed.    Portions of this note were generated with Scientist, clinical (histocompatibility and immunogenetics). Dictation errors may occur despite best attempts at proofreading.   Final Clinical Impression(s) / ED Diagnoses Final diagnoses:  Acute right-sided low back pain without sciatica    Rx / DC Orders ED Discharge Orders         Ordered    predniSONE (DELTASONE) 20 MG tablet  Daily        06/26/20 1502           Shanon Ace, PA-C 06/26/20 1942    Jacalyn Lefevre, MD 06/28/20 1505

## 2020-06-26 NOTE — ED Notes (Signed)
Patient Alert and oriented to baseline. Stable and ambulatory to baseline. Patient verbalized understanding of the discharge instructions.  Patient belongings were taken by the patient.   

## 2020-06-26 NOTE — Discharge Instructions (Signed)
All your blood work today was normal. Your EKG was normal. Your x-rays showed your scoliosis that we already knew.   -Recommend you take Tylenol and ibuprofen for your pain.  Take as record on the bottle.  -Prescription sent to your pharmacy for prednisone. It is possible your pain is being caused by a pinched nerve. You already had one today so take the next dose tomorrow.  -You can buy over-the-counter lidocaine patches for your back pain as well.  Keep your appointment with the spine specialist.  Return to the emergency department for any new or worsening symptoms.

## 2020-06-26 NOTE — ED Triage Notes (Signed)
Pt reports burning pain to R mid and upper back x 2 weeks that is gradually getting worse.  Reports bilateral leg numbness, tingling in toes, tingling in bilateral arms and hands, pain across chest, and SOB.  States R sided back pain is sharp and worse with deep inspiration.

## 2020-07-02 ENCOUNTER — Emergency Department (HOSPITAL_COMMUNITY)

## 2020-07-02 ENCOUNTER — Encounter (HOSPITAL_COMMUNITY): Payer: Self-pay

## 2020-07-02 ENCOUNTER — Other Ambulatory Visit: Payer: Self-pay

## 2020-07-02 ENCOUNTER — Inpatient Hospital Stay (HOSPITAL_COMMUNITY)
Admission: EM | Admit: 2020-07-02 | Discharge: 2020-07-08 | DRG: 556 | Disposition: A | Discharge status: Home / Self Care | Attending: Internal Medicine | Admitting: Internal Medicine

## 2020-07-02 DIAGNOSIS — E610 Copper deficiency: Secondary | ICD-10-CM | POA: Diagnosis present

## 2020-07-02 DIAGNOSIS — Z20822 Contact with and (suspected) exposure to covid-19: Secondary | ICD-10-CM | POA: Diagnosis not present

## 2020-07-02 DIAGNOSIS — K5900 Constipation, unspecified: Secondary | ICD-10-CM | POA: Diagnosis present

## 2020-07-02 DIAGNOSIS — Z87440 Personal history of urinary (tract) infections: Secondary | ICD-10-CM | POA: Diagnosis not present

## 2020-07-02 DIAGNOSIS — R251 Tremor, unspecified: Secondary | ICD-10-CM | POA: Diagnosis present

## 2020-07-02 DIAGNOSIS — E876 Hypokalemia: Secondary | ICD-10-CM | POA: Diagnosis not present

## 2020-07-02 DIAGNOSIS — R531 Weakness: Principal | ICD-10-CM

## 2020-07-02 DIAGNOSIS — E559 Vitamin D deficiency, unspecified: Secondary | ICD-10-CM | POA: Diagnosis present

## 2020-07-02 DIAGNOSIS — J45909 Unspecified asthma, uncomplicated: Secondary | ICD-10-CM | POA: Diagnosis present

## 2020-07-02 DIAGNOSIS — F4310 Post-traumatic stress disorder, unspecified: Secondary | ICD-10-CM | POA: Diagnosis present

## 2020-07-02 DIAGNOSIS — Z634 Disappearance and death of family member: Secondary | ICD-10-CM

## 2020-07-02 DIAGNOSIS — Z818 Family history of other mental and behavioral disorders: Secondary | ICD-10-CM | POA: Diagnosis not present

## 2020-07-02 DIAGNOSIS — F419 Anxiety disorder, unspecified: Secondary | ICD-10-CM | POA: Diagnosis not present

## 2020-07-02 DIAGNOSIS — F319 Bipolar disorder, unspecified: Secondary | ICD-10-CM | POA: Diagnosis not present

## 2020-07-02 DIAGNOSIS — R339 Retention of urine, unspecified: Secondary | ICD-10-CM | POA: Diagnosis not present

## 2020-07-02 DIAGNOSIS — Z87891 Personal history of nicotine dependence: Secondary | ICD-10-CM | POA: Diagnosis not present

## 2020-07-02 DIAGNOSIS — R338 Other retention of urine: Secondary | ICD-10-CM | POA: Diagnosis present

## 2020-07-02 DIAGNOSIS — M797 Fibromyalgia: Principal | ICD-10-CM | POA: Diagnosis present

## 2020-07-02 DIAGNOSIS — M419 Scoliosis, unspecified: Secondary | ICD-10-CM | POA: Diagnosis present

## 2020-07-02 DIAGNOSIS — F32A Depression, unspecified: Secondary | ICD-10-CM | POA: Diagnosis present

## 2020-07-02 LAB — CBC WITH DIFFERENTIAL/PLATELET
Abs Immature Granulocytes: 0.03 10*3/uL (ref 0.00–0.07)
Basophils Absolute: 0 10*3/uL (ref 0.0–0.1)
Basophils Relative: 1 %
Eosinophils Absolute: 0 10*3/uL (ref 0.0–0.5)
Eosinophils Relative: 0 %
HCT: 40.4 % (ref 36.0–46.0)
Hemoglobin: 13.9 g/dL (ref 12.0–15.0)
Immature Granulocytes: 0 %
Lymphocytes Relative: 24 %
Lymphs Abs: 2.1 10*3/uL (ref 0.7–4.0)
MCH: 31.7 pg (ref 26.0–34.0)
MCHC: 34.4 g/dL (ref 30.0–36.0)
MCV: 92.2 fL (ref 80.0–100.0)
Monocytes Absolute: 0.5 10*3/uL (ref 0.1–1.0)
Monocytes Relative: 6 %
Neutro Abs: 6.1 10*3/uL (ref 1.7–7.7)
Neutrophils Relative %: 69 %
Platelets: 295 10*3/uL (ref 150–400)
RBC: 4.38 MIL/uL (ref 3.87–5.11)
RDW: 11.6 % (ref 11.5–15.5)
WBC: 8.8 10*3/uL (ref 4.0–10.5)
nRBC: 0 % (ref 0.0–0.2)

## 2020-07-02 LAB — COMPREHENSIVE METABOLIC PANEL
ALT: 13 U/L (ref 0–44)
AST: 13 U/L — ABNORMAL LOW (ref 15–41)
Albumin: 4.3 g/dL (ref 3.5–5.0)
Alkaline Phosphatase: 34 U/L — ABNORMAL LOW (ref 38–126)
Anion gap: 10 (ref 5–15)
BUN: 26 mg/dL — ABNORMAL HIGH (ref 6–20)
CO2: 24 mmol/L (ref 22–32)
Calcium: 9.3 mg/dL (ref 8.9–10.3)
Chloride: 104 mmol/L (ref 98–111)
Creatinine, Ser: 0.95 mg/dL (ref 0.44–1.00)
GFR, Estimated: >60 mL/min (ref >60)
Glucose, Bld: 82 mg/dL (ref 70–99)
Potassium: 3.4 mmol/L — ABNORMAL LOW (ref 3.5–5.1)
Sodium: 138 mmol/L (ref 135–145)
Total Bilirubin: 1.5 mg/dL — ABNORMAL HIGH (ref 0.3–1.2)
Total Protein: 7.7 g/dL (ref 6.5–8.1)

## 2020-07-02 LAB — CK: Total CK: 202 U/L (ref 38–234)

## 2020-07-02 LAB — I-STAT BETA HCG BLOOD, ED (MC, WL, AP ONLY): I-stat hCG, quantitative: <5 m[IU]/mL (ref <5)

## 2020-07-02 LAB — SEDIMENTATION RATE: Sed Rate: 4 mm/hr (ref 0–22)

## 2020-07-02 MED ORDER — HYDROMORPHONE HCL 1 MG/ML IJ SOLN
1.0000 mg | Freq: Once | INTRAMUSCULAR | Status: AC
  Administered 2020-07-02: 1 mg via INTRAVENOUS
  Filled 2020-07-02: qty 1

## 2020-07-02 MED ORDER — MORPHINE SULFATE (PF) 2 MG/ML IV SOLN
2.0000 mg | Freq: Once | INTRAVENOUS | Status: DC
  Filled 2020-07-02: qty 1

## 2020-07-02 MED ORDER — LORAZEPAM 2 MG/ML IJ SOLN
0.5000 mg | Freq: Once | INTRAMUSCULAR | Status: AC
  Administered 2020-07-03: 01:00:00 0.5 mg via INTRAVENOUS
  Filled 2020-07-02: qty 1

## 2020-07-02 MED ORDER — LORAZEPAM 2 MG/ML IJ SOLN
0.5000 mg | Freq: Once | INTRAMUSCULAR | Status: AC
  Administered 2020-07-02: 21:00:00 0.5 mg via INTRAVENOUS
  Filled 2020-07-02: qty 1

## 2020-07-02 MED ORDER — MORPHINE SULFATE (PF) 2 MG/ML IV SOLN: 2.0000 mg | Freq: Once | INTRAVENOUS | Status: DC

## 2020-07-02 MED ORDER — KETOROLAC TROMETHAMINE 30 MG/ML IJ SOLN
30.0000 mg | Freq: Once | INTRAMUSCULAR | Status: AC
  Administered 2020-07-02: 20:00:00 30 mg via INTRAVENOUS
  Filled 2020-07-02: qty 1

## 2020-07-02 MED ORDER — GADOBUTROL 1 MMOL/ML IV SOLN
7.0000 mL | Freq: Once | INTRAVENOUS | Status: AC | PRN
  Administered 2020-07-02: 19:00:00 7 mL via INTRAVENOUS

## 2020-07-02 MED ORDER — FENTANYL CITRATE (PF) 100 MCG/2ML IJ SOLN
50.0000 ug | Freq: Once | INTRAMUSCULAR | Status: AC
  Administered 2020-07-02: 50 ug via INTRAMUSCULAR
  Filled 2020-07-02: qty 2

## 2020-07-02 MED ORDER — HYDROMORPHONE HCL 1 MG/ML IJ SOLN
1.0000 mg | Freq: Once | INTRAMUSCULAR | Status: AC
  Administered 2020-07-02: 21:00:00 1 mg via INTRAVENOUS
  Filled 2020-07-02: qty 1

## 2020-07-02 NOTE — ED Provider Notes (Signed)
  Physical Exam  BP 137/82 (BP Location: Right Arm)   Pulse 89   Temp 99 F (37.2 C) (Oral)   Resp 20   LMP 06/10/2020   SpO2 100%   Physical Exam  ED Course/Procedures     Procedures  MDM   Patient signed out to me by Carma Lair, PA-C.  Please see previous notes for further history.  Brief, patient presenting for evaluation of diffuse back pain preventing ambulation.  She also has numbness/tingling in hands and legs.  She has an intentional tremor of the upper extremities as well as diffuse weakness.  Hyperreflexia, equal bilaterally.  She had negative MRI of the head and neck.  Neuro was consulted, recommended LP.  This was attempted in the ER, but was unsuccessful.  She will need IR LP.  Neuro will evaluate her tonight, however recommends admission for further work-up. Admission call pending.   Discussed with Dr. Allena Katz from [facility], patient to be admitted.   Alveria Apley, PA-C 07/02/20 2343    Paula Libra, MD 07/03/20 [phone]

## 2020-07-02 NOTE — ED Notes (Signed)
Pt states she is in pain and is having anxiety, provider made aware.

## 2020-07-02 NOTE — ED Notes (Signed)
Pt is c/o pain, will medicate.

## 2020-07-02 NOTE — H&P (Addendum)
History and Physical    Krithika Tome EHM:[id number] DOB: 15-Oct-2000 DOA: 07/02/2020  PCP: Patient, No Pcp Per  Patient coming from: Home  I have personally briefly reviewed patient's old medical records in [place]  Chief Complaint: Back pain, weakness in extremities, numbness/tingling of extremities  HPI: Kristina Bean is a 19 y.o. female with medical history significant for asthma, bipolar 1 disorder, PTSD/anxiety/depression, scoliosis who presents to the ED for evaluation of significant weakness in extremities, neuropathic pain, joint pain.  Patient states she was in her usual state of health, ambulating on her own power and able to complete all activities until a few months ago.  She says she began to have UTI symptoms with dysuria, suprapubic, and bilateral flank discomfort.  She was having financial issues therefore did not seek medical assistance until she had a E-visit last month (06/04/2020 per chart).  Given her reported symptoms she was prescribed a 5-day course of nitrofurantoin which she said she completed.  She says after she completed her antibiotics she had a few days of flulike symptoms with fevers.  She was able to manage at home.  She was not tested for flu or Covid during that time.  Over the last 2 weeks she began to develop low back pain, initially right-sided.  Then she began to have shooting pains which felt like never coming from her back.  Initially shooting pains were in her head, then began to occur in her arms and legs.  Pains would vary in which extremities they would occur.  She also had associated numbness/tingling with pins-and-needles sensations.  Last week she began to have significant upper back pain and stiffness.  She felt like she had a burning sensation in her chest.  She was seen in the ED on 06/26/2020 for her back pain.  X-rays of the chest, thoracic spine, and lumbar spine were obtained and showed scoliosis.  Symptoms were felt related to  radicular pain or pinched nerve.  She was discharged to home with short burst of prednisone.  Patient says she had minimal improvement with prednisone.  Since then she has had decreased mobility.  She noticed significant weakness in her hands with new difficulty in grasp.  She said she had difficulty opening packages.  She then began to have weakness and numbness in her legs with feelings of pins/needles sensation in the bottom of her feet.  Next she developed tremors in her hands.  She says 4 days ago she began to have joint pain in her knees, fingers, toes, and pretty much any joint area.  Joint pain would vary in location during his episodes.  She has not seen any swelling of her joints or any obvious skin changes/rashes.  She also reports having TMJ symptoms and some difficulty with opening her jaw at times.  She noticed having a transient episode of right eye pain. She has had a few episodes of diarrhea which she correlates to cheese/dairy products.  She denies receiving any recent vaccinations.  She has not had any obvious insect/tick bites.  She says she had a few scratches from a kitten on her arms which did not appear to become infected.  She denies any recent travel other than to [place], [place] in the past year.  She says she works at home where she lives with her boyfriend and young daughter.  She reports rare vaping use and occasional shots of liquor.  She denies any marijuana, cocaine, IV drug use, methamphetamine use.  She denies  any known drug allergies.  She says her mother is deceased and had a history of thyroid, breast, and colon cancer as well as diabetes and heart disease.  She is not currently taking any medications regularly.  She uses Zofran, Tylenol, ibuprofen as needed.  ED Course:  Initial vitals showed BP 133/90, pulse 88, RR 18, temp 99.8 F, SPO2 98% on room air.  Labs show WBC 8.8, hemoglobin 13.9, platelets 295,000, sodium 138, potassium 3.4, bicarb 24, BUN 26,  creatinine 0.95, serum glucose 82, AST 13, ALT 13, alk phos 34, total bilirubin 1.5, ESR 4, CK 202.  I-STAT beta-hCG <5.0.  SARS-CoV-2 PCR is ordered and pending.  MRI brain and cervical spine are obtained with normal MRI appearance of the brain and cervical spinal cord.  Patient was given IV Dilaudid 1 mg x 2, IM fentanyl 50 mcg once, Toradol 30 mg IV once, Ativan 0.5 mg IV once.  EDP discussed the case with on-call neurology who felt patient requires admission for further work-up including LP.  LP was attempted in the ED but unsuccessful due to patient scoliosis.  IR consulted to perform LP in the morning.  Neurology to formally consult on patient tonight.  Hospitalist service was consulted to admit for further evaluation and management.  Review of Systems: All systems reviewed and are negative except as documented in history of present illness above.   Past Medical History:  Diagnosis Date  . Anxiety   . Asthma   . Bipolar 1 disorder ([place])   . Depression   . Miscarriage   . Scoliosis     History reviewed. No pertinent surgical history.  Social History:  reports that she has been smoking. She has quit using smokeless tobacco. She reports current alcohol use. She reports that she does not use drugs.  No Known Allergies  Family History  Problem Relation Age of Onset  . Breast cancer Mother   . Thyroid cancer Mother   . Colon cancer Mother   . Diabetes Mother   . Heart disease Mother      Prior to Admission medications   Medication Sig Start Date End Date Taking? Authorizing Provider  ibuprofen (ADVIL) 800 MG tablet Take 800 mg by mouth every 8 (eight) hours as needed for moderate pain.   Yes [provider]  nitrofurantoin, macrocrystal-monohydrate, (MACROBID) 100 MG capsule Take 1 capsule (100 mg total) by mouth 2 (two) times daily. Patient not taking: No sig reported 06/04/20   Margarita Mail, PA-C  predniSONE (DELTASONE) 20 MG tablet Take 1 tablet (20 mg total)  by mouth daily for 5 days. 06/27/20 07/02/20  Barrie Folk, PA-C    Physical Exam: Vitals:   07/02/20 1524 07/02/20 1649 07/02/20 1947 07/02/20 2228  BP: 134/64 116/68 136/71 137/82  Pulse: 67 66 91 89  Resp: '18 16 18 20  ' Temp: 98.9 F (37.2 C)  99 F (37.2 C)   TempSrc: Oral  Oral   SpO2: 100% 99% 100% 100%   Constitutional: Resting in bed with head elevated, somewhat anxious and uncomfortable but otherwise calm and appropriate. Eyes: PERRL, EOMI, lids and conjunctivae normal ENMT: Mucous membranes are moist. Posterior pharynx clear of any exudate or lesions.Normal dentition.  Neck: normal, supple, no masses. Respiratory: clear to auscultation bilaterally, no wheezing, no crackles. Normal respiratory effort. No accessory muscle use.  Cardiovascular: Regular rate and rhythm, no murmurs / rubs / gallops. No extremity edema. 2+ pedal pulses. Abdomen: Mild tenderness suprapubic region, no masses palpated. No  hepatosplenomegaly. Bowel sounds positive.  Musculoskeletal: no clubbing / cyanosis. No joint deformity upper and lower extremities.  Generalized weakness all extremities. Skin: no rashes, lesions, ulcers. No induration.  Tattoos left upper extremity. Neurologic: CN 2-12 grossly intact. Sensation intact with hypersensitivity to light touch.  Strength diminished to 0-1/5 all extremities, question due to pain versus weakness. Psychiatric: Normal judgment and insight. Alert and oriented x 3.  Anxious mood.   Labs on Admission: I have personally reviewed following labs and imaging studies  CBC: Recent Labs  Lab 06/26/20 1206 07/02/20 1410  WBC 4.5 8.8  NEUTROABS  --  6.1  HGB 13.8 13.9  HCT 42.0 40.4  MCV 94.6 92.2  PLT 293 093   Basic Metabolic Panel: Recent Labs  Lab 06/26/20 1206 07/02/20 1410  NA 140 138  K 3.7 3.4*  CL 106 104  CO2 23 24  GLUCOSE 88 82  BUN 18 26*  CREATININE 0.85 0.95  CALCIUM 9.6 9.3   GFR: Estimated Creatinine Clearance: 91.1  mL/min (by C-G formula based on SCr of 0.95 mg/dL). Liver Function Tests: Recent Labs  Lab 07/02/20 1410  AST 13*  ALT 13  ALKPHOS 34*  BILITOT 1.5*  PROT 7.7  ALBUMIN 4.3   No results for input(s): LIPASE, AMYLASE in the last 168 hours. No results for input(s): AMMONIA in the last 168 hours. Coagulation Profile: No results for input(s): INR, PROTIME in the last 168 hours. Cardiac Enzymes: Recent Labs  Lab 07/02/20 1410  CKTOTAL 202   BNP (last 3 results) No results for input(s): PROBNP in the last 8760 hours. HbA1C: No results for input(s): HGBA1C in the last 72 hours. CBG: No results for input(s): GLUCAP in the last 168 hours. Lipid Profile: No results for input(s): CHOL, HDL, LDLCALC, TRIG, CHOLHDL, LDLDIRECT in the last 72 hours. Thyroid Function Tests: No results for input(s): TSH, T4TOTAL, FREET4, T3FREE, THYROIDAB in the last 72 hours. Anemia Panel: No results for input(s): VITAMINB12, FOLATE, FERRITIN, TIBC, IRON, RETICCTPCT in the last 72 hours. Urine analysis:    Component Value Date/Time   COLORURINE YELLOW 04/04/2019 1600   APPEARANCEUR CLOUDY (A) 04/04/2019 1600   LABSPEC 1.025 04/04/2019 1600   PHURINE 6.0 04/04/2019 1600   GLUCOSEU NEGATIVE 04/04/2019 1600   HGBUR SMALL (A) 04/04/2019 1600   BILIRUBINUR NEGATIVE 04/04/2019 1600   KETONESUR NEGATIVE 04/04/2019 1600   PROTEINUR NEGATIVE 04/04/2019 1600   NITRITE NEGATIVE 04/04/2019 1600   LEUKOCYTESUR TRACE (A) 04/04/2019 1600    Radiological Exams on Admission: MR Brain W and Wo Contrast  Result Date: 07/02/2020 CLINICAL DATA:  19 year old female with progressive generalized extremity weakness. Loss of consciousness. EXAM: MRI HEAD WITHOUT AND WITH CONTRAST MRI CERVICAL SPINE WITHOUT AND WITH CONTRAST TECHNIQUE: Multiplanar, multiecho pulse sequences of the brain and surrounding structures, and cervical spine, to include the craniocervical junction and cervicothoracic junction, were obtained without  and with intravenous contrast. CONTRAST:  80m GADAVIST GADOBUTROL 1 MMOL/ML IV SOLN COMPARISON:  None. FINDINGS: MRI HEAD FINDINGS Brain: Normal limits cerebral volume and midline structures is within appear normally formed. No restricted diffusion to suggest acute infarction. No midline shift, mass effect, evidence of mass lesion, ventriculomegaly, extra-axial collection or acute intracranial hemorrhage. Cervicomedullary junction and pituitary are within normal limits. No abnormal gray or white matter signal identified. No encephalomalacia identified. No chronic cerebral blood products. No abnormal enhancement identified. No dural thickening. Vascular: Major intracranial vascular flow voids are preserved. Skull and upper cervical spine: Bone marrow signal is within  normal limits for age. Sinuses/Orbits: Negative orbits. Mild left maxillary and ethmoid sinus mucosal thickening. Other: Mastoids are clear. Grossly normal visible internal auditory structures. MRI CERVICAL SPINE FINDINGS Alignment: Mild reversal of cervical lordosis and dextroconvex cervical scoliosis. No spondylolisthesis. Vertebrae: No marrow edema or evidence of acute osseous abnormality. Visualized bone marrow signal is within normal limits. Cord: Normal. Fairly capacious cervical and upper thoracic spinal canal. No abnormal intradural enhancement or dural thickening. Posterior Fossa, vertebral arteries, paraspinal tissues: Cervicomedullary junction is within normal limits. Preserved major vascular flow voids in the neck. Tortuous right vertebral artery a especially at the right C4 neural foramen. Negative visible neck soft tissues. Disc levels: No significant degenerative changes despite mild scoliosis. No spinal or foraminal stenosis. IMPRESSION: 1. Normal MRI appearance of the brain and cervical spinal cord. 2. No significant degenerative changes despite mild cervical scoliosis and reversal of lordosis. Electronically Signed   By: Genevie Ann M.D.    On: 07/02/2020 19:51   MR Cervical Spine W or Wo Contrast  Result Date: 07/02/2020 CLINICAL DATA:  19 year old female with progressive generalized extremity weakness. Loss of consciousness. EXAM: MRI HEAD WITHOUT AND WITH CONTRAST MRI CERVICAL SPINE WITHOUT AND WITH CONTRAST TECHNIQUE: Multiplanar, multiecho pulse sequences of the brain and surrounding structures, and cervical spine, to include the craniocervical junction and cervicothoracic junction, were obtained without and with intravenous contrast. CONTRAST:  28m GADAVIST GADOBUTROL 1 MMOL/ML IV SOLN COMPARISON:  None. FINDINGS: MRI HEAD FINDINGS Brain: Normal limits cerebral volume and midline structures is within appear normally formed. No restricted diffusion to suggest acute infarction. No midline shift, mass effect, evidence of mass lesion, ventriculomegaly, extra-axial collection or acute intracranial hemorrhage. Cervicomedullary junction and pituitary are within normal limits. No abnormal gray or white matter signal identified. No encephalomalacia identified. No chronic cerebral blood products. No abnormal enhancement identified. No dural thickening. Vascular: Major intracranial vascular flow voids are preserved. Skull and upper cervical spine: Bone marrow signal is within normal limits for age. Sinuses/Orbits: Negative orbits. Mild left maxillary and ethmoid sinus mucosal thickening. Other: Mastoids are clear. Grossly normal visible internal auditory structures. MRI CERVICAL SPINE FINDINGS Alignment: Mild reversal of cervical lordosis and dextroconvex cervical scoliosis. No spondylolisthesis. Vertebrae: No marrow edema or evidence of acute osseous abnormality. Visualized bone marrow signal is within normal limits. Cord: Normal. Fairly capacious cervical and upper thoracic spinal canal. No abnormal intradural enhancement or dural thickening. Posterior Fossa, vertebral arteries, paraspinal tissues: Cervicomedullary junction is within normal limits.  Preserved major vascular flow voids in the neck. Tortuous right vertebral artery a especially at the right C4 neural foramen. Negative visible neck soft tissues. Disc levels: No significant degenerative changes despite mild scoliosis. No spinal or foraminal stenosis. IMPRESSION: 1. Normal MRI appearance of the brain and cervical spinal cord. 2. No significant degenerative changes despite mild cervical scoliosis and reversal of lordosis. Electronically Signed   By: HGenevie AnnM.D.   On: 07/02/2020 19:51    EKG: Not performed.  Assessment/Plan Principal Problem:   Weakness  Rafia JFredianiis a 19y.o. female with medical history significant for asthma, bipolar 1 disorder, PTSD/anxiety/depression, scoliosis who is admitted with generalized weakness/joint pain/neuropathy.  Progress weakness of extremities/joint pain/neuropathy: Unclear etiology however there is concern for neurologic condition such as Guillain-Barr syndrome or multiple sclerosis.  Potentially incited by reported flulike illness.  Autoimmune disorder also possible.  MRI brain and cervical spine negative. -Neurology consulted and to see -IR consulted for LP in a.m. -Keep n.p.o., hold VTE prophylaxis -  Check UA, urine culture, blood culture -Check HIV antibody, RPR, TSH, Mg, Ph, B12, ferritin -Continue neurochecks -Place on maintenance IV fluids overnight while n.p.o.  Asthma: Stable without wheezing.  Use albuterol as needed.  Bipolar disorder/anxiety/PTSD: Not currently following with psychiatry or on medication.  She reports worsened anxiety due to her recent symptoms.  DVT prophylaxis: SCDs given planned LP Code Status: Full code Family Communication: Discussed with patient Disposition Plan: From home, dispo pending further work-up of presenting illness, neurology evaluation Consults called: Neurology Admission status:  Status is: Observation  The patient remains OBS appropriate and will d/c before 2 midnights.  Dispo:  The patient is from: Home              Anticipated d/c is to: Home              Anticipated d/c date is: 1 day              Patient currently is not medically stable to d/c.   Zada Finders MD [facility]  If 7PM-7AM, please contact night-coverage [email or web address]  07/03/2020, 12:38 AM

## 2020-07-02 NOTE — ED Notes (Signed)
Returned from MRI 

## 2020-07-02 NOTE — ED Notes (Signed)
Pt has been moved to room 23 for lumbar puncture. LP tray setup at bedside ann PA and MD notified.

## 2020-07-02 NOTE — ED Triage Notes (Signed)
Pt presents pain in her mid and upper back. Pt reports that over the past 3 months, the pain has been getting worse, going down her legs. Pt reports pain is so bad she is unable to walk or even zip up a jacket. Pt is tearful in triage.

## 2020-07-02 NOTE — ED Provider Notes (Addendum)
[facility]-EMERGENCY DEPT Provider Note   CSN: [id number] Arrival date & time: 07/02/20  1324     History Chief Complaint  Patient presents with  . Back Pain    Millee Schlossberg is a 19 y.o. female.  HPI 19 year old female with history exam, asthma, pill 1 disorder, depression, scoliosis presents to the ER with complaints of back pain, joint pain, body aches and inability to walk over the last 2 weeks.  Patient states that she was seen here in the ER on 12/19, had some x-rays done which confirmed her scoliosis but showed no other acute abnormalities, was sent home on some prednisone and told to continue to take Tylenol and ibuprofen.  Patient states that she took the prednisone with no relief.  She states that the pain initially started in her back, but will sometimes travel up her back and into her neck.  She states that it also came with some numbness in her hands, and has now progressed to having numbness in her lower extremities bilaterally.  She states she has difficulty doing ADLs, including dressing herself.  She denies any loss of bowel bladder control.  No fevers or chills.  No history of IV drug use.  She states that she has been unable to walk for the last few days and her boyfriend has had to carry her around.  She reports a prior history of severe vitamin D deficiency as a child, but has not seen a PCP or had labs drawn recently.  She does not take any vitamin D supplements.  She states she has a frequent history of UTIs but no other chronic medical issues.    Past Medical History:  Diagnosis Date  . Anxiety   . Asthma   . Bipolar 1 disorder (HCC)   . Depression   . Miscarriage   . Scoliosis     There are no problems to display for this patient.   History reviewed. No pertinent surgical history.   OB History    Gravida  1   Para      Term      Preterm      AB      Living        SAB      IAB      Ectopic      Multiple      Live  Births              History reviewed. No pertinent family history.  Social History   Tobacco Use  . Smoking status: Current Some Day Smoker  . Smokeless tobacco: Former Neurosurgeon  . Tobacco comment: 1 cigarette every few weeks  Substance Use Topics  . Alcohol use: Never  . Drug use: Never    Home Medications Prior to Admission medications   Medication Sig Start Date End Date Taking? Authorizing Provider  ibuprofen (ADVIL) 800 MG tablet Take 800 mg by mouth every 8 (eight) hours as needed for moderate pain.   Yes [provider]  nitrofurantoin, macrocrystal-monohydrate, (MACROBID) 100 MG capsule Take 1 capsule (100 mg total) by mouth 2 (two) times daily. Patient not taking: No sig reported 06/04/20   Arthor Captain, PA-C  predniSONE (DELTASONE) 20 MG tablet Take 1 tablet (20 mg total) by mouth daily for 5 days. 06/27/20 07/02/20  Shanon Ace, PA-C    Allergies    Patient has no known allergies.  Review of Systems   Review of Systems  Constitutional: Negative  for chills and fever.  HENT: Negative for ear pain and sore throat.   Eyes: Negative for pain and visual disturbance.  Respiratory: Negative for cough and shortness of breath.   Cardiovascular: Negative for chest pain and palpitations.  Gastrointestinal: Negative for abdominal pain and vomiting.  Genitourinary: Negative for dysuria and hematuria.  Musculoskeletal: Negative for arthralgias and back pain.  Skin: Negative for color change and rash.  Neurological: Positive for tremors, weakness, numbness and headaches. Negative for seizures, syncope and facial asymmetry.  All other systems reviewed and are negative.   Physical Exam Updated Vital Signs BP 137/82 (BP Location: Right Arm)   Pulse 89   Temp 99 F (37.2 C) (Oral)   Resp 20   LMP 06/10/2020   SpO2 100%   Physical Exam Vitals and nursing note reviewed.  Constitutional:      General: She is not in acute distress.    Appearance: She  is well-developed and well-nourished.     Comments: Tearful, anxious appearing  HENT:     Head: Normocephalic and atraumatic.  Eyes:     Conjunctiva/sclera: Conjunctivae normal.  Cardiovascular:     Rate and Rhythm: Normal rate and regular rhythm.     Heart sounds: No murmur heard.   Pulmonary:     Effort: Pulmonary effort is normal. No respiratory distress.     Breath sounds: Normal breath sounds.  Abdominal:     Palpations: Abdomen is soft.     Tenderness: There is no abdominal tenderness.  Musculoskeletal:        General: No edema.     Cervical back: Neck supple.  Skin:    General: Skin is warm and dry.  Neurological:     General: No focal deficit present.     Mental Status: She is alert.     Cranial Nerves: No cranial nerve deficit.     Motor: Weakness present.     Comments: Patient unable to move lower extremities.  Weak grip strength bilaterally in upper extremities, with slight tremor.  Patient endorses numbness in her left leg.  Hyperreflexic in patellar and Achilles and brachioradialis reflexes bilaterally.  She has midline tenderness to the C, T, L-spine.  Full range of motion of neck.  Unable to ambulate here in the ED.  Psychiatric:        Mood and Affect: Mood and affect normal.     ED Results / Procedures / Treatments   Labs (all labs ordered are listed, but only abnormal results are displayed) Labs Reviewed  COMPREHENSIVE METABOLIC PANEL - Abnormal; Notable for the following components:      Result Value   Potassium 3.4 (*)    BUN 26 (*)    AST 13 (*)    Alkaline Phosphatase 34 (*)    Total Bilirubin 1.5 (*)    All other components within normal limits  RESP PANEL BY RT-PCR (FLU A&B, COVID) ARPGX2  CSF CULTURE  CSF CULTURE  CBC WITH DIFFERENTIAL/PLATELET  SEDIMENTATION RATE  CK  CSF CELL COUNT WITH DIFFERENTIAL  PROTEIN AND GLUCOSE, CSF  CSF CELL COUNT WITH DIFFERENTIAL  CSF CELL COUNT WITH DIFFERENTIAL  OLIGOCLONAL BANDS, CSF + SERM  DRAW EXTRA  CLOT TUBE  IGG CSF INDEX  DRAW EXTRA CLOT TUBE  LYME DISEASE DNA BY PCR(BORRELIA BURG)  B. BURGDORFI ANTIBODIES  I-STAT BETA HCG BLOOD, ED ([facility], [facility], [facility] ONLY)    EKG None  Radiology MR Brain W and Wo Contrast  Result Date: 07/02/2020 CLINICAL DATA:  19 year old female with progressive generalized extremity weakness. Loss of consciousness. EXAM: MRI HEAD WITHOUT AND WITH CONTRAST MRI CERVICAL SPINE WITHOUT AND WITH CONTRAST TECHNIQUE: Multiplanar, multiecho pulse sequences of the brain and surrounding structures, and cervical spine, to include the craniocervical junction and cervicothoracic junction, were obtained without and with intravenous contrast. CONTRAST:  7mL GADAVIST GADOBUTROL 1 MMOL/ML IV SOLN COMPARISON:  None. FINDINGS: MRI HEAD FINDINGS Brain: Normal limits cerebral volume and midline structures is within appear normally formed. No restricted diffusion to suggest acute infarction. No midline shift, mass effect, evidence of mass lesion, ventriculomegaly, extra-axial collection or acute intracranial hemorrhage. Cervicomedullary junction and pituitary are within normal limits. No abnormal gray or white matter signal identified. No encephalomalacia identified. No chronic cerebral blood products. No abnormal enhancement identified. No dural thickening. Vascular: Major intracranial vascular flow voids are preserved. Skull and upper cervical spine: Bone marrow signal is within normal limits for age. Sinuses/Orbits: Negative orbits. Mild left maxillary and ethmoid sinus mucosal thickening. Other: Mastoids are clear. Grossly normal visible internal auditory structures. MRI CERVICAL SPINE FINDINGS Alignment: Mild reversal of cervical lordosis and dextroconvex cervical scoliosis. No spondylolisthesis. Vertebrae: No marrow edema or evidence of acute osseous abnormality. Visualized bone marrow signal is within normal limits. Cord: Normal. Fairly capacious cervical and upper thoracic spinal canal. No  abnormal intradural enhancement or dural thickening. Posterior Fossa, vertebral arteries, paraspinal tissues: Cervicomedullary junction is within normal limits. Preserved major vascular flow voids in the neck. Tortuous right vertebral artery a especially at the right C4 neural foramen. Negative visible neck soft tissues. Disc levels: No significant degenerative changes despite mild scoliosis. No spinal or foraminal stenosis. IMPRESSION: 1. Normal MRI appearance of the brain and cervical spinal cord. 2. No significant degenerative changes despite mild cervical scoliosis and reversal of lordosis. Electronically Signed   By: Odessa FlemingH  Hall M.D.   On: 07/02/2020 19:51   MR Cervical Spine W or Wo Contrast  Result Date: 07/02/2020 CLINICAL DATA:  19 year old female with progressive generalized extremity weakness. Loss of consciousness. EXAM: MRI HEAD WITHOUT AND WITH CONTRAST MRI CERVICAL SPINE WITHOUT AND WITH CONTRAST TECHNIQUE: Multiplanar, multiecho pulse sequences of the brain and surrounding structures, and cervical spine, to include the craniocervical junction and cervicothoracic junction, were obtained without and with intravenous contrast. CONTRAST:  7mL GADAVIST GADOBUTROL 1 MMOL/ML IV SOLN COMPARISON:  None. FINDINGS: MRI HEAD FINDINGS Brain: Normal limits cerebral volume and midline structures is within appear normally formed. No restricted diffusion to suggest acute infarction. No midline shift, mass effect, evidence of mass lesion, ventriculomegaly, extra-axial collection or acute intracranial hemorrhage. Cervicomedullary junction and pituitary are within normal limits. No abnormal gray or white matter signal identified. No encephalomalacia identified. No chronic cerebral blood products. No abnormal enhancement identified. No dural thickening. Vascular: Major intracranial vascular flow voids are preserved. Skull and upper cervical spine: Bone marrow signal is within normal limits for age. Sinuses/Orbits:  Negative orbits. Mild left maxillary and ethmoid sinus mucosal thickening. Other: Mastoids are clear. Grossly normal visible internal auditory structures. MRI CERVICAL SPINE FINDINGS Alignment: Mild reversal of cervical lordosis and dextroconvex cervical scoliosis. No spondylolisthesis. Vertebrae: No marrow edema or evidence of acute osseous abnormality. Visualized bone marrow signal is within normal limits. Cord: Normal. Fairly capacious cervical and upper thoracic spinal canal. No abnormal intradural enhancement or dural thickening. Posterior Fossa, vertebral arteries, paraspinal tissues: Cervicomedullary junction is within normal limits. Preserved major vascular flow voids in the neck. Tortuous right vertebral artery a especially at the right C4 neural foramen.  Negative visible neck soft tissues. Disc levels: No significant degenerative changes despite mild scoliosis. No spinal or foraminal stenosis. IMPRESSION: 1. Normal MRI appearance of the brain and cervical spinal cord. 2. No significant degenerative changes despite mild cervical scoliosis and reversal of lordosis. Electronically Signed   By: Odessa Fleming M.D.   On: 07/02/2020 19:51    Procedures .Lumbar Puncture  Date/Time: 07/02/2020 10:40 PM Performed by: Mare Ferrari, PA-C Authorized by: Mare Ferrari, PA-C   Consent:    Consent obtained:  Verbal and written   Consent given by:  Patient   Risks, benefits, and alternatives were discussed: yes     Risks discussed:  Bleeding, headache, infection, nerve damage, pain and repeat procedure   Alternatives discussed:  No treatment Universal protocol:    Procedure explained and questions answered to patient or proxy's satisfaction: yes     Relevant documents present and verified: yes     Imaging studies available: yes     Site/side marked: yes     Patient identity confirmed:  Verbally with patient Pre-procedure details:    Procedure purpose:  Diagnostic   Preparation: Patient was prepped  and draped in usual sterile fashion   Anesthesia:    Anesthesia method:  Local infiltration   Local anesthetic:  Lidocaine 1% WITH epi Procedure details:    Lumbar space:  L3-L4 interspace   Patient position:  Sitting   Needle gauge:  22   Needle type:  Spinal needle - Quincke tip   Needle length (in):  2.5   Ultrasound guidance: no     Number of attempts:  2 Post-procedure details:    Procedure completion:  Tolerated Comments:     Unsuccessful lumbar puncture   (including critical care time)  Medications Ordered in ED Medications  fentaNYL (SUBLIMAZE) injection 50 mcg (50 mcg Intramuscular Given 07/02/20 1531)  HYDROmorphone (DILAUDID) injection 1 mg (1 mg Intravenous Given 07/02/20 1743)  gadobutrol (GADAVIST) 1 MMOL/ML injection 7 mL (7 mLs Intravenous Contrast Given 07/02/20 1859)  ketorolac (TORADOL) 30 MG/ML injection 30 mg (30 mg Intravenous Given 07/02/20 2013)  HYDROmorphone (DILAUDID) injection 1 mg (1 mg Intravenous Given 07/02/20 2101)  LORazepam (ATIVAN) injection 0.5 mg (0.5 mg Intravenous Given 07/02/20 2102)    ED Course  I have reviewed the triage vital signs and the nursing notes.  Pertinent labs & imaging results that were available during my care of the patient were reviewed by me and considered in my medical decision making (see chart for details).    MDM Rules/Calculators/A&P                          19 year old female with complaints of joint pains, numbness in upper and lower extremities, back pain, inability to walk over the last few days.  On arrival, vitals overall reassuring, afebrile.  No history of IV drug use.  Physical exam with patient unable to move either lower extremities.  She endorses numbness in her left leg.  Hyperreflexic on exam.  Weak upper extremity grip strength.  No numbness in her upper extremities.  Midline tenderness to the C, T, L-spine, full range of motion of neck.  Abdomen is soft and nontender  DDx includes possible MS,  Guillain-Barr, cervical spinal compression, sepsis, osteomyelitis/discitis, fibromyalgia, lupus, psychogenic  Labs ordered, reviewed and interpreted by myself -CBC without leukocytosis, normal hemoglobin -CMP with mild hypokalemia of 3.4, no other electrode abnormalities.  Normal renal function test.  Normal  liver function test. -Normal CK -Negative pregnancy -Normal sed rate  MDM: Very unclear and peculiar presentation.  Patient was also seen and evaluated by my supervising physician Dr. Fredderick Phenix.  I spoke with Dr.Absher with neurology who recommends starting with a MRI of the brain with and without contrast and MRI of the cervical spine with and without contrast.  Low suspicion for lumbar or thoracic pathology as she does have upper extremity pain and weakness.  I personally reviewed and interpreted these images along with radiology, which were both normal.  Patient was treated here originally with fentanyl, 2 mg of Dilaudid, Toradol.  Progressively throughout the ED stay, the patient endorsed extreme anxiety especially after the MRI.  States she has a history of bipolar disorder and depression.  She is not on any medications for this.  She was given 0.5 mg of Ativan and did feel slightly less anxious.  She however still states that she is in such severe pain that she cannot walk or move her extremities.  We discussed her options, explained that it is not safe to send her home given we do not know the cause of her symptoms and she is unable to ambulate at home.  She is agreeable to admission, possible psych consult and PT consult.  Possible fibromyalgia or  lupus. Potential psychiatric element to this as well. GBS is still on the differential. Pt is not complaining of one side being weaker than the other. However given patient's inability to walk, do not think it is safe for her to go home.  10:05 PM: Spoke with hospitalist Dr. Allena Katz who feels she does not meet admission criteria.  I did speak with  Dr. Derry Lory who agrees with the patient is not safe to go home and needs further work-up given her inability to ambulate.  He is requesting a lumbar puncture to rule out possible Guillan Barre. Dr. Fredderick Phenix and I attempted a lumbar puncture in the ED but were unsuccessful in procuring CSF 2/2 to the patient's scoliosis. Will need IR to this.  Dr. Alphonzo Grieve will also come to [facility] around 1 or 2 AM to see and evaluate the patient.  She will need admission.  Reconsult hospitalist team for admission.   Care signed out to [facility] PA-C who will take the admission call. Pt remains hemodynamically stable for admission at this time.   This was a shared visit with my supervising physician Dr. Fredderick Phenix who independently saw and evaluated the patient & provided guidance in evaluation/management/disposition ,in agreement with care  Final Clinical Impression(s) / ED Diagnoses Final diagnoses:  Weakness    Rx / DC Orders ED Discharge Orders    None          Leone Brand 07/02/20 2307    Rolan Bucco, MD 07/03/20 1309

## 2020-07-02 NOTE — ED Notes (Signed)
Note clarification, fentanyl was given IV per ok with MD, was charted as IM, was not given IM

## 2020-07-03 ENCOUNTER — Encounter (HOSPITAL_COMMUNITY): Payer: Self-pay | Admitting: Internal Medicine

## 2020-07-03 ENCOUNTER — Observation Stay (HOSPITAL_COMMUNITY)

## 2020-07-03 DIAGNOSIS — R292 Abnormal reflex: Secondary | ICD-10-CM | POA: Diagnosis not present

## 2020-07-03 DIAGNOSIS — R29898 Other symptoms and signs involving the musculoskeletal system: Secondary | ICD-10-CM

## 2020-07-03 DIAGNOSIS — R531 Weakness: Secondary | ICD-10-CM | POA: Diagnosis not present

## 2020-07-03 LAB — URINALYSIS, ROUTINE W REFLEX MICROSCOPIC
Bilirubin Urine: NEGATIVE
Glucose, UA: NEGATIVE mg/dL
Hgb urine dipstick: NEGATIVE
Ketones, ur: 80 mg/dL — AB
Leukocytes,Ua: NEGATIVE
Nitrite: NEGATIVE
Protein, ur: NEGATIVE mg/dL
Specific Gravity, Urine: 1.033 — ABNORMAL HIGH (ref 1.005–1.030)
pH: 6 (ref 5.0–8.0)

## 2020-07-03 LAB — TSH: TSH: 2.668 u[IU]/mL (ref 0.350–4.500)

## 2020-07-03 LAB — PROTEIN, CSF: Total  Protein, CSF: 17 mg/dL (ref 15–45)

## 2020-07-03 LAB — VITAMIN B12: Vitamin B-12: 544 pg/mL (ref 180–914)

## 2020-07-03 LAB — PROTIME-INR
INR: 1.1 (ref 0.8–1.2)
Prothrombin Time: 13.7 seconds (ref 11.4–15.2)

## 2020-07-03 LAB — CSF CELL COUNT WITH DIFFERENTIAL
RBC Count, CSF: 9 /mm3 — ABNORMAL HIGH
Tube #: 1
WBC, CSF: 1 /mm3 (ref 0–5)

## 2020-07-03 LAB — RESP PANEL BY RT-PCR (FLU A&B, COVID) ARPGX2
Influenza A by PCR: NEGATIVE
Influenza B by PCR: NEGATIVE
SARS Coronavirus 2 by RT PCR: NEGATIVE

## 2020-07-03 LAB — BASIC METABOLIC PANEL
Anion gap: 12 (ref 5–15)
BUN: 27 mg/dL — ABNORMAL HIGH (ref 6–20)
CO2: 22 mmol/L (ref 22–32)
Calcium: 9 mg/dL (ref 8.9–10.3)
Chloride: 103 mmol/L (ref 98–111)
Creatinine, Ser: 0.94 mg/dL (ref 0.44–1.00)
GFR, Estimated: >60 mL/min (ref >60)
Glucose, Bld: 105 mg/dL — ABNORMAL HIGH (ref 70–99)
Potassium: 3.1 mmol/L — ABNORMAL LOW (ref 3.5–5.1)
Sodium: 137 mmol/L (ref 135–145)

## 2020-07-03 LAB — CBC
HCT: 40.6 % (ref 36.0–46.0)
Hemoglobin: 13.7 g/dL (ref 12.0–15.0)
MCH: 31.5 pg (ref 26.0–34.0)
MCHC: 33.7 g/dL (ref 30.0–36.0)
MCV: 93.3 fL (ref 80.0–100.0)
Platelets: 284 10*3/uL (ref 150–400)
RBC: 4.35 MIL/uL (ref 3.87–5.11)
RDW: 11.5 % (ref 11.5–15.5)
WBC: 7.8 10*3/uL (ref 4.0–10.5)
nRBC: 0 % (ref 0.0–0.2)

## 2020-07-03 LAB — RPR: RPR Ser Ql: NONREACTIVE

## 2020-07-03 LAB — MAGNESIUM: Magnesium: 2.1 mg/dL (ref 1.7–2.4)

## 2020-07-03 LAB — GLUCOSE, CSF: Glucose, CSF: 61 mg/dL (ref 40–70)

## 2020-07-03 LAB — VITAMIN D 25 HYDROXY (VIT D DEFICIENCY, FRACTURES): Vit D, 25-Hydroxy: 14.42 ng/mL — ABNORMAL LOW (ref 30–100)

## 2020-07-03 LAB — FERRITIN: Ferritin: 37 ng/mL (ref 11–307)

## 2020-07-03 LAB — PHOSPHORUS: Phosphorus: 3.8 mg/dL (ref 2.5–4.6)

## 2020-07-03 LAB — HIV ANTIBODY (ROUTINE TESTING W REFLEX): HIV Screen 4th Generation wRfx: NONREACTIVE

## 2020-07-03 LAB — C-REACTIVE PROTEIN: CRP: <0.5 mg/dL (ref <1.0)

## 2020-07-03 MED ORDER — ACETAMINOPHEN 500 MG PO TABS
1000.0000 mg | ORAL_TABLET | Freq: Four times a day (QID) | ORAL | Status: DC | PRN
  Administered 2020-07-03 – 2020-07-08 (×12): 1000 mg via ORAL
  Filled 2020-07-03 (×12): qty 2

## 2020-07-03 MED ORDER — GADOBUTROL 1 MMOL/ML IV SOLN
7.0000 mL | Freq: Once | INTRAVENOUS | Status: AC | PRN
  Administered 2020-07-03: 7 mL via INTRAVENOUS

## 2020-07-03 MED ORDER — ONDANSETRON HCL 4 MG PO TABS: 4.0000 mg | ORAL_TABLET | Freq: Four times a day (QID) | ORAL | Status: DC | PRN

## 2020-07-03 MED ORDER — KCL-LACTATED RINGERS-D5W 20 MEQ/L IV SOLN
INTRAVENOUS | Status: AC
  Filled 2020-07-03 (×2): qty 1000

## 2020-07-03 MED ORDER — ALBUTEROL SULFATE HFA 108 (90 BASE) MCG/ACT IN AERS: 1.0000 | INHALATION_SPRAY | Freq: Four times a day (QID) | RESPIRATORY_TRACT | Status: DC | PRN

## 2020-07-03 MED ORDER — VITAMIN D 25 MCG (1000 UNIT) PO TABS
1000.0000 [IU] | ORAL_TABLET | Freq: Every day | ORAL | Status: DC
  Administered 2020-07-03: 19:00:00 1000 [IU] via ORAL
  Filled 2020-07-03: qty 1

## 2020-07-03 MED ORDER — POTASSIUM CHLORIDE CRYS ER 20 MEQ PO TBCR
40.0000 meq | EXTENDED_RELEASE_TABLET | Freq: Once | ORAL | Status: AC
  Administered 2020-07-03: 17:00:00 40 meq via ORAL
  Filled 2020-07-03: qty 2

## 2020-07-03 MED ORDER — POTASSIUM CHLORIDE 2 MEQ/ML IV SOLN: INTRAVENOUS | Status: DC

## 2020-07-03 MED ORDER — TRAMADOL HCL 50 MG PO TABS
50.0000 mg | ORAL_TABLET | Freq: Three times a day (TID) | ORAL | Status: DC | PRN
  Administered 2020-07-03 – 2020-07-04 (×4): 50 mg via ORAL
  Filled 2020-07-03 (×4): qty 1

## 2020-07-03 MED ORDER — MUSCLE RUB 10-15 % EX CREA
TOPICAL_CREAM | CUTANEOUS | Status: DC | PRN
  Administered 2020-07-05 – 2020-07-07 (×2): 1 via TOPICAL
  Filled 2020-07-03: qty 85

## 2020-07-03 MED ORDER — ONDANSETRON HCL 4 MG/2ML IJ SOLN
4.0000 mg | Freq: Four times a day (QID) | INTRAMUSCULAR | Status: DC | PRN
  Administered 2020-07-03 – 2020-07-05 (×2): 4 mg via INTRAVENOUS
  Filled 2020-07-03 (×2): qty 2

## 2020-07-03 MED ORDER — LORAZEPAM 2 MG/ML IJ SOLN
1.0000 mg | Freq: Once | INTRAMUSCULAR | Status: AC
  Administered 2020-07-03: 05:00:00 1 mg via INTRAVENOUS
  Filled 2020-07-03: qty 1

## 2020-07-03 MED ORDER — LORAZEPAM 2 MG/ML IJ SOLN: 1.0000 mg | Freq: Once | INTRAMUSCULAR | Status: DC | PRN

## 2020-07-03 MED ORDER — HYDROMORPHONE HCL 1 MG/ML IJ SOLN
0.5000 mg | INTRAMUSCULAR | Status: DC | PRN
  Administered 2020-07-03 – 2020-07-05 (×13): 0.5 mg via INTRAVENOUS
  Filled 2020-07-03 (×2): qty 0.5
  Filled 2020-07-03: qty 1
  Filled 2020-07-03 (×10): qty 0.5

## 2020-07-03 MED ORDER — HYDROXYZINE HCL 10 MG PO TABS
10.0000 mg | ORAL_TABLET | Freq: Three times a day (TID) | ORAL | Status: DC | PRN
  Administered 2020-07-03 – 2020-07-08 (×11): 10 mg via ORAL
  Filled 2020-07-03 (×14): qty 1

## 2020-07-03 NOTE — Plan of Care (Signed)

## 2020-07-03 NOTE — Consult Note (Signed)
NEUROLOGY CONSULTATION NOTE   Date of service: July 03, 2020 Patient Name: Kristina Bean MRN:  [id number] DOB:  2000/11/07 Reason for consult: "Leg and arm weakness and numbness" _ _ _   _ __   _ __ _ _  __ __   _ __   __ _  History of Present Illness  Kristina Bean is a 19 y.o. female with PMH significant for anxiety, depression, Bipolar 1, Asthma, Scoliosis who presents with multiple neurological complaints including back pain and stiffness, joint pains in her hands and feet, numbness in left leg worse than right and inability to walk for the last couple of days.  Symptoms have been progressive over the peroid of few weeks, but have acutely worsened over the last couple of days.  Her symptoms started as burning and sharp pain in urinary area and back a few months ago. Did nt have insurance and when finally got insurance, was treated as a UTI with Antibiotics. Also endorses a hx of cyst in her falopian tube and attributes worsening of her back pain to that and therefore did not seek attention.  Had symptoms consistent with flu/cold about 3 weeks ago. She was lethargic, achy, sneezing and coughing. Took some vitamin C, could not find her thermometer so did not measure her temperature but was burning up. Rest and vitamins made her better. Lasted 3-4 days, then was fine.  Even before then, was having some sharp pains in her head here and there was not painful enough so she blew it off but they got worse.  During the flu/cold episode also had terrible back pain. Not typical of her back pain due to scoliosis, this was in her upper,then loewr back not close to her R shoulder where she typically reports pain due to sciatica. From there, it rapidly progressed to shooting pain from her back spreading to her arms and legs. Predominantly, pain is in upper back but also lower is also giving her trouble. Also reports a very strong burning sensation in the upper back to the right.  Last week, feeling  sluggish, feels like her back is stiff. Tried exercises, massage and did not help. Laid down and had extreme burning pain in her back.  Also feels pain in her joints, but also reports pain shooting from back into the legs. Also has aching pain around her joints and muscles hurt.  Has not been able to walk for the last 2 days, was limping prior to that. Feels that this is more of a problem with both weakness and pain.  Has not been eating well for the last few weeks. Took a shot of hard liquor last week but no other significant EtOh use, no recreational substances, does not smoke. Not sure if there is history of autoimmune disorders in her family, maybe ?thyoid disease.  Endorses some urinary urgency, alternates between contipation and diarrhea.   ROS   Constitutional Denies weight loss, endorses fever and chills.  HEENT Denies changes in vision and hearing.  Respiratory Denies SOB and cough.  CV Endorses palpitations from panic attacks but no CP  GI Denies abdominal pain, endorses nausea, but no vomiting and diarrhea.  GU Denies dysuria, endorses urinary frequency.  MSK Endorses myalgia and joint pain.  Skin Denies rash and pruritus.  Neurological Denies headache and syncope.  Psychiatric Denies recent changes in mood. Endorses anxiety and depression. No SI. Feels this is due to her current illness.   Past History   Past Medical History:  Diagnosis Date  . Anxiety   . Asthma   . Bipolar 1 disorder ([place])   . Depression   . Miscarriage   . Scoliosis    History reviewed. No pertinent surgical history. Family History  Problem Relation Age of Onset  . Breast cancer Mother   . Thyroid cancer Mother   . Colon cancer Mother   . Diabetes Mother   . Heart disease Mother    Social History   Socioeconomic History  . Marital status: Single    Spouse name: Not on file  . Number of children: Not on file  . Years of education: Not on file  . Highest education level: Not on file   Occupational History  . Not on file  Tobacco Use  . Smoking status: Current Some Day Smoker  . Smokeless tobacco: Former Systems developer  . Tobacco comment: 1 cigarette every few weeks  Substance and Sexual Activity  . Alcohol use: Yes    Comment: Occasional shot of liquor  . Drug use: Never  . Sexual activity: Yes    Birth control/protection: None  Other Topics Concern  . Not on file  Social History Narrative  . Not on file   Social Determinants of Health   Financial Resource Strain: Not on file  Food Insecurity: Not on file  Transportation Needs: Not on file  Physical Activity: Not on file  Stress: Not on file  Social Connections: Not on file   No Known Allergies  Medications  (Not in a hospital admission)    Vitals   Vitals:   07/02/20 1524 07/02/20 1649 07/02/20 1947 07/02/20 2228  BP: 134/64 116/68 136/71 137/82  Pulse: 67 66 91 89  Resp: '18 16 18 20  ' Temp: 98.9 F (37.2 C)  99 F (37.2 C)   TempSrc: Oral  Oral   SpO2: 100% 99% 100% 100%     There is no height or weight on file to calculate BMI.  Physical Exam   General: Laying comfortably in bed; in no acute distress. HENT: Normal oropharynx and mucosa. Normal external appearance of ears and nose. Neck: Supple, no pain or tenderness CV: No JVD. No peripheral edema. Pulmonary: Symmetric Chest rise. Normal respiratory effort. Abdomen: Soft to touch, non-tender. Ext: No cyanosis, edema, or deformity Skin: No rash. Normal palpation of skin.  Musculoskeletal: Normal digits and nails by inspection. No clubbing.  Neurologic Examination  Mental status/Cognition: Alert, oriented to self, place, month and year, good attention. Speech/language: Fluent, comprehension intact, object naming intact, repetition intact. Cranial nerves:   CN II Pupils equal and reactive to light, no VF deficits   CN III,IV,VI EOM intact, no gaze preference or deviation, no nystagmus   CN V normal sensation in V1, V2, and V3 segments  bilaterally   CN VII no asymmetry, no nasolabial fold flattening   CN VIII normal hearing to speech   CN IX & X normal palatal elevation, no uvular deviation   CN XI 5/5 head turn and 5/5 shoulder shrug bilaterally   CN XII midline tongue protrusion    Motor:  Muscle bulk: poor, tone normal, pronator drift none tremor none Mvmt Root Nerve  Muscle Right Left Comments  SA C5/6 Ax Deltoid 4+ 4+   EF C5/6 Mc Biceps 4+ 4+   EE C6/7/8 Rad Triceps 4 4   WF C6/7 Med FCR 4+ 4+   WE C7/8 PIN ECU 4+ 4+   F Ab C8/T1 U ADM/FDI 4+ 4+   HF L1/2/3  Fem Illopsoas 3- 3-   KE L2/3/4 Fem Quad 2 2   DF L4/5 D Peron Tib Ant 1 1   PF S1/2 Tibial Grc/Sol 1 1    Reflexes:  Right Left Comments  Pectoralis      Biceps (C5/6) 2+ 2+   Brachioradialis (C5/6) 2+ 2+    Triceps (C6/7) 2 2    Patellar (L3/4) 3 3 Cross adductors + bilaterally   Achilles (S1) 4 4 Spontaneous sustained clonus   Hoffman      Plantar mute mute   Jaw jerk    Sensation:  Light touch Decreased to about 50% in RLE, specifically in her left calf and leg and foot.   Pin prick Decreased in RLE in no dermatomal pattern.   Temperature    Vibration Intact throughout  Proprioception Intact throughout   Coordination/Complex Motor:  - Finger to Nose intact BL - Heel to shin unable to assess due to weakness. - Rapid alternating movement are slowed. - Gait: Unable to assess due to weakness, has not been walking for the last 2 days.  Labs   CBC:  Recent Labs  Lab 06/26/20 1206 07/02/20 1410  WBC 4.5 8.8  NEUTROABS  --  6.1  HGB 13.8 13.9  HCT 42.0 40.4  MCV 94.6 92.2  PLT 293 833   Basic Metabolic Panel:  Lab Results  Component Value Date   NA 138 07/02/2020   K 3.4 (L) 07/02/2020   CO2 24 07/02/2020   GLUCOSE 82 07/02/2020   BUN 26 (H) 07/02/2020   CREATININE 0.95 07/02/2020   CALCIUM 9.3 07/02/2020   GFRNONAA >60 07/02/2020   GFRAA >60 01/28/2019   Lipid Panel: No results found for: LDLCALC HgbA1c: No results  found for: HGBA1C Urine Drug Screen: No results found for: LABOPIA, COCAINSCRNUR, LABBENZ, AMPHETMU, THCU, LABBARB  Alcohol Level No results found for: ETH  MRI Brain w/wo C: Normal MRI Brain  MRI C spine w/wo C Normal MRI C spine.  Impression   Kristina Bean is a 19 y.o. female with PMH significant for anxiety, depression, Bipolar 1, Asthma, Scoliosis who presents with multiple neurological complaints. History is provided is disorganized but major symptoms include burning and sharp lower back and urinary (genital) pain x few months. Flu/cold like symptoms 3 weeks ago followed by worsening upper and lower back pain with shooting pain in her hands and feet with stiffness in her back and joint pain inher hands and feet. Limping over the last few days and an inability to walk for the last 2 days and R leg numbess. Neuro exam with some effort dependent weakness in BL upper extremities. This may be due to her getting some ativan and opiods over the last few hours. With encouragement, she has about 4+ strength in BL upper in most muscle groups. She is plegic in BL lower extremities despite encouragement. The most effort I could get was a 2-3 in most muscle groups in her legs. She also has a non dermatomal distribution sensory deficit to pinprick and touch in her R lower leg. Rest of the exam with hyperreflexia in BL lower extremities worse than BL upper. She has spontaneous clonus in BL lower extremities along with + cross adductors. The constellation of her symptoms specially with lhermitte's sign, known hx of dextroscoliosis, genital paresthesias that are off and on and hyperreflexia predominantly in her lower extremities is concerning for a potential spinal cord pathology including transverse myelitis or cord compression. However, spinal cord compresion also  typically involves a sensory level and affects dorsal columns too which is not the case.  Althou GBS is on the differential specially with her hx of  flu like symptoms followed by BL lower extremity weakness, it is atypical for it to present with hyperreflexia specially given that it involves lower motor neurons. It is also unlikely to cause asymeetric sensory deficit. I would still recommend LP to look for albuminocytological disociation, specially with the disorganized history.  Recommendations  - I ordered STAT MRI T and L spine with and without contrast (unable to get in touch with MRI techs despite attempt, notified primary team to consider to [facility] if MRI is not offered on holidays or overnight) - Recommend LP with CSF cell count, differential, protein, glucose, Oligoclonal bands, IgG index, Lyme PCR on CSF. - Recommend serum Vit B12, TSH, folate, RPR, HIV, West Nile, Copper with ceruloplasmin, Zinc, ESR, CRP, ANA with IFA, RF, DSDNA, ACE and Soluble IL2 Receptor(sendout), GAD 65 Ab. ______________________________________________________________________   Thank you for the opportunity to take part in the care of this patient. If you have any further questions, please contact the neurology consultation attending.  Signed,  [place] Pager Number [phone] _ _ _   _ __   _ __ _ _  __ __   _ __   __ _

## 2020-07-03 NOTE — ED Notes (Signed)
Neurologist at bedside. 

## 2020-07-03 NOTE — Progress Notes (Addendum)
PROGRESS NOTE    Danielly Ackerley  VVO:[id number] DOB: 02-27-2001 DOA: 07/02/2020 PCP: Patient, No Pcp Per    Chief Complaint  Patient presents with  . Back Pain    Brief Narrative:  Kaylia Winborne is a 19 y.o. female with medical history significant for asthma, bipolar 1 disorder, PTSD/anxiety/depression, scoliosis who presents to the ED for evaluation of significant weakness in extremities, neuropathic pain, joint pain.  Subjective:  She is seen after return from lumbar puncture, she continues to complain of generalized pain and weakness She can barely lift her legs against gravity due to pain and weakness, reports decreased sensation to touch bilateral lower extremities She denies bowel and bladder incontinence No fever   Assessment & Plan:   Principal Problem:   Weakness   Progressive weakness of extremities, especially lower extremities/joint pain/neuropathy -Unclear etiology, CK unremarkable -MRI brain, cervical, lumbar and thoracic spine no acute findings -Status post LP today, fluid study pending -Neurology consulted, input appreciated, will follow recommendation  Hypokalemia, replace potassium, recheck in the morning.  Magnesium 2.1  vitD Deficiency: start vitD supplement   Asthma: Stable without wheezing.  Use albuterol as needed.  Bipolar disorder/anxiety/PTSD: Not currently following with psychiatry or on medication.  She reports worsened anxiety due to her recent symptoms.  DVT prophylaxis: SCDs Start: 07/03/20 0014   Code Status: Full Family Communication: Patient Disposition:   Status is: Observation  Dispo: The patient is from: Home              Anticipated d/c is to: To be determined              Anticipated d/c date is: TBD                Consultants:   Neurology  Interventional radiology  Procedures:   Fluoroscopic guided LP  Antimicrobials:   None     Objective: Vitals:   07/03/20 0237 07/03/20 0408 07/03/20 0638 07/03/20  0823  BP: 131/86 130/74 124/67 127/85  Pulse: 72 (!) 57 60 (!) 54  Resp: 15 19 16 18   Temp: 98.1 F (36.7 C) 98.2 F (36.8 C) 98.6 F (37 C) 98.6 F (37 C)  TempSrc:  Oral Oral Oral  SpO2: 100% 100% 99% 100%    Intake/Output Summary (Last 24 hours) at 07/03/2020 0834 Last data filed at 07/03/2020 0600 Gross per 24 hour  Intake 193.09 ml  Output --  Net 193.09 ml   There were no vitals filed for this visit.  Examination:  General exam: calm, NAD Respiratory system: Clear to auscultation. Respiratory effort normal. Cardiovascular system: S1 & S2 heard, RRR. No JVD, no murmur, No pedal edema. Gastrointestinal system: Abdomen is nondistended, soft and nontender.  Normal bowel sounds heard. Central nervous system: Alert and oriented. No focal neurological deficits. Extremities: Able to move upper extremities, not able to lift lower extremities against gravity Skin: No rashes, lesions or ulcers Psychiatry: Flat affect    Data Reviewed: I have personally reviewed following labs and imaging studies  CBC: Recent Labs  Lab 06/26/20 1206 07/02/20 1410 07/03/20 0301  WBC 4.5 8.8 7.8  NEUTROABS  --  6.1  --   HGB 13.8 13.9 13.7  HCT 42.0 40.4 40.6  MCV 94.6 92.2 93.3  PLT 293 295 284    Basic Metabolic Panel: Recent Labs  Lab 06/26/20 1206 07/02/20 1410 07/03/20 0301  NA 140 138 137  K 3.7 3.4* 3.1*  CL 106 104 103  CO2 23 24 22  GLUCOSE 88 82 105*  BUN 18 26* 27*  CREATININE 0.85 0.95 0.94  CALCIUM 9.6 9.3 9.0  MG  --   --  2.1  PHOS  --   --  3.8    GFR: Estimated Creatinine Clearance: 92.1 mL/min (by C-G formula based on SCr of 0.94 mg/dL).  Liver Function Tests: Recent Labs  Lab 07/02/20 1410  AST 13*  ALT 13  ALKPHOS 34*  BILITOT 1.5*  PROT 7.7  ALBUMIN 4.3    CBG: No results for input(s): GLUCAP in the last 168 hours.   Recent Results (from the past 240 hour(s))  Resp Panel by RT-PCR (Flu A&B, Covid) Nasopharyngeal Swab     Status:  None   Collection Time: 07/03/20 12:47 AM   Specimen: Nasopharyngeal Swab; Nasopharyngeal(NP) swabs in vial transport medium  Result Value Ref Range Status   SARS Coronavirus 2 by RT PCR NEGATIVE NEGATIVE Final    Comment: (NOTE) SARS-CoV-2 target nucleic acids are NOT DETECTED.  The SARS-CoV-2 RNA is generally detectable in upper respiratory specimens during the acute phase of infection. The lowest concentration of SARS-CoV-2 viral copies this assay can detect is 138 copies/mL. A negative result does not preclude SARS-Cov-2 infection and should not be used as the sole basis for treatment or other patient management decisions. A negative result may occur with  improper specimen collection/handling, submission of specimen other than nasopharyngeal swab, presence of viral mutation(s) within the areas targeted by this assay, and inadequate number of viral copies(<138 copies/mL). A negative result must be combined with clinical observations, patient history, and epidemiological information. The expected result is Negative.  Fact Sheet for Patients:  BloggerCourse.com  Fact Sheet for Healthcare Providers:  SeriousBroker.it  This test is no t yet approved or cleared by the [place] FDA and  has been authorized for detection and/or diagnosis of SARS-CoV-2 by FDA under an Emergency Use Authorization (EUA). This EUA will remain  in effect (meaning this test can be used) for the duration of the COVID-19 declaration under Section 564(b)(1) of the Act, 21 U.S.C.section 360bbb-3(b)(1), unless the authorization is terminated  or revoked sooner.       Influenza A by PCR NEGATIVE NEGATIVE Final   Influenza B by PCR NEGATIVE NEGATIVE Final    Comment: (NOTE) The Xpert Xpress SARS-CoV-2/FLU/RSV plus assay is intended as an aid in the diagnosis of influenza from Nasopharyngeal swab specimens and should not be used as a sole basis for  treatment. Nasal washings and aspirates are unacceptable for Xpert Xpress SARS-CoV-2/FLU/RSV testing.  Fact Sheet for Patients: BloggerCourse.com  Fact Sheet for Healthcare Providers: SeriousBroker.it  This test is not yet approved or cleared by the [place] FDA and has been authorized for detection and/or diagnosis of SARS-CoV-2 by FDA under an Emergency Use Authorization (EUA). This EUA will remain in effect (meaning this test can be used) for the duration of the COVID-19 declaration under Section 564(b)(1) of the Act, 21 U.S.C. section 360bbb-3(b)(1), unless the authorization is terminated or revoked.  Performed at [facility], [address], [place], [place] [address]          Radiology Studies: MR Brain W and Wo Contrast  Result Date: 07/02/2020 CLINICAL DATA:  18 year old female with progressive generalized extremity weakness. Loss of consciousness. EXAM: MRI HEAD WITHOUT AND WITH CONTRAST MRI CERVICAL SPINE WITHOUT AND WITH CONTRAST TECHNIQUE: Multiplanar, multiecho pulse sequences of the brain and surrounding structures, and cervical spine, to include the craniocervical junction and cervicothoracic  junction, were obtained without and with intravenous contrast. CONTRAST:  7mL GADAVIST GADOBUTROL 1 MMOL/ML IV SOLN COMPARISON:  None. FINDINGS: MRI HEAD FINDINGS Brain: Normal limits cerebral volume and midline structures is within appear normally formed. No restricted diffusion to suggest acute infarction. No midline shift, mass effect, evidence of mass lesion, ventriculomegaly, extra-axial collection or acute intracranial hemorrhage. Cervicomedullary junction and pituitary are within normal limits. No abnormal gray or white matter signal identified. No encephalomalacia identified. No chronic cerebral blood products. No abnormal enhancement identified. No dural thickening. Vascular: Major intracranial  vascular flow voids are preserved. Skull and upper cervical spine: Bone marrow signal is within normal limits for age. Sinuses/Orbits: Negative orbits. Mild left maxillary and ethmoid sinus mucosal thickening. Other: Mastoids are clear. Grossly normal visible internal auditory structures. MRI CERVICAL SPINE FINDINGS Alignment: Mild reversal of cervical lordosis and dextroconvex cervical scoliosis. No spondylolisthesis. Vertebrae: No marrow edema or evidence of acute osseous abnormality. Visualized bone marrow signal is within normal limits. Cord: Normal. Fairly capacious cervical and upper thoracic spinal canal. No abnormal intradural enhancement or dural thickening. Posterior Fossa, vertebral arteries, paraspinal tissues: Cervicomedullary junction is within normal limits. Preserved major vascular flow voids in the neck. Tortuous right vertebral artery a especially at the right C4 neural foramen. Negative visible neck soft tissues. Disc levels: No significant degenerative changes despite mild scoliosis. No spinal or foraminal stenosis. IMPRESSION: 1. Normal MRI appearance of the brain and cervical spinal cord. 2. No significant degenerative changes despite mild cervical scoliosis and reversal of lordosis. Electronically Signed   By: Odessa Fleming M.D.   On: 07/02/2020 19:51   MR Cervical Spine W or Wo Contrast  Result Date: 07/02/2020 CLINICAL DATA:  19 year old female with progressive generalized extremity weakness. Loss of consciousness. EXAM: MRI HEAD WITHOUT AND WITH CONTRAST MRI CERVICAL SPINE WITHOUT AND WITH CONTRAST TECHNIQUE: Multiplanar, multiecho pulse sequences of the brain and surrounding structures, and cervical spine, to include the craniocervical junction and cervicothoracic junction, were obtained without and with intravenous contrast. CONTRAST:  7mL GADAVIST GADOBUTROL 1 MMOL/ML IV SOLN COMPARISON:  None. FINDINGS: MRI HEAD FINDINGS Brain: Normal limits cerebral volume and midline structures is  within appear normally formed. No restricted diffusion to suggest acute infarction. No midline shift, mass effect, evidence of mass lesion, ventriculomegaly, extra-axial collection or acute intracranial hemorrhage. Cervicomedullary junction and pituitary are within normal limits. No abnormal gray or white matter signal identified. No encephalomalacia identified. No chronic cerebral blood products. No abnormal enhancement identified. No dural thickening. Vascular: Major intracranial vascular flow voids are preserved. Skull and upper cervical spine: Bone marrow signal is within normal limits for age. Sinuses/Orbits: Negative orbits. Mild left maxillary and ethmoid sinus mucosal thickening. Other: Mastoids are clear. Grossly normal visible internal auditory structures. MRI CERVICAL SPINE FINDINGS Alignment: Mild reversal of cervical lordosis and dextroconvex cervical scoliosis. No spondylolisthesis. Vertebrae: No marrow edema or evidence of acute osseous abnormality. Visualized bone marrow signal is within normal limits. Cord: Normal. Fairly capacious cervical and upper thoracic spinal canal. No abnormal intradural enhancement or dural thickening. Posterior Fossa, vertebral arteries, paraspinal tissues: Cervicomedullary junction is within normal limits. Preserved major vascular flow voids in the neck. Tortuous right vertebral artery a especially at the right C4 neural foramen. Negative visible neck soft tissues. Disc levels: No significant degenerative changes despite mild scoliosis. No spinal or foraminal stenosis. IMPRESSION: 1. Normal MRI appearance of the brain and cervical spinal cord. 2. No significant degenerative changes despite mild cervical scoliosis and reversal of lordosis. Electronically  Signed   By: Odessa Fleming M.D.   On: 07/02/2020 19:51   MR THORACIC SPINE W WO CONTRAST  Result Date: 07/03/2020 CLINICAL DATA:  Demyelinating disease. Spinal stenosis. Lumbosacral back pain. Cauda equina syndrome  suspected. EXAM: MRI THORACIC AND LUMBAR SPINE WITHOUT AND WITH CONTRAST TECHNIQUE: Multiplanar and multiecho pulse sequences of the thoracic and lumbar spine were obtained without and with intravenous contrast. CONTRAST:  21mL GADAVIST GADOBUTROL 1 MMOL/ML IV SOLN COMPARISON:  None. FINDINGS: MRI THORACIC SPINE FINDINGS Alignment: S-shaped scoliosis of the thoracolumbar spine with thoracic levocurvature. Vertebrae: No fracture, evidence of discitis, or bone lesion. Cord:  Normal signal and morphology. Paraspinal and other soft tissues: Negative. Disc levels: Well preserved disc height and hydration. No facet spurring. No neural impingement MRI LUMBAR SPINE FINDINGS Segmentation:  5 lumbar type vertebrae Alignment:  Upper lumbar and lower thoracic dextrocurvature. Vertebrae:  No fracture, evidence of discitis, or bone lesion. Conus medullaris: Extends to the L1-2 level and appears normal. No abnormal intrathecal enhancement or cauda equina thickening. Paraspinal and other soft tissues: Negative Disc levels: Generally preserved disc height and hydration. Negative for facet spurring or neural impingement. IMPRESSION: 1. Normal appearance of the spinal cord and cauda equina. 2. Thoracolumbar scoliosis. 3. No neural impingement or visible inflammation. Electronically Signed   By: Marnee Spring M.D.   On: 07/03/2020 06:31   MR Lumbar Spine W Wo Contrast  Result Date: 07/03/2020 CLINICAL DATA:  Demyelinating disease. Spinal stenosis. Lumbosacral back pain. Cauda equina syndrome suspected. EXAM: MRI THORACIC AND LUMBAR SPINE WITHOUT AND WITH CONTRAST TECHNIQUE: Multiplanar and multiecho pulse sequences of the thoracic and lumbar spine were obtained without and with intravenous contrast. CONTRAST:  54mL GADAVIST GADOBUTROL 1 MMOL/ML IV SOLN COMPARISON:  None. FINDINGS: MRI THORACIC SPINE FINDINGS Alignment: S-shaped scoliosis of the thoracolumbar spine with thoracic levocurvature. Vertebrae: No fracture, evidence of  discitis, or bone lesion. Cord:  Normal signal and morphology. Paraspinal and other soft tissues: Negative. Disc levels: Well preserved disc height and hydration. No facet spurring. No neural impingement MRI LUMBAR SPINE FINDINGS Segmentation:  5 lumbar type vertebrae Alignment:  Upper lumbar and lower thoracic dextrocurvature. Vertebrae:  No fracture, evidence of discitis, or bone lesion. Conus medullaris: Extends to the L1-2 level and appears normal. No abnormal intrathecal enhancement or cauda equina thickening. Paraspinal and other soft tissues: Negative Disc levels: Generally preserved disc height and hydration. Negative for facet spurring or neural impingement. IMPRESSION: 1. Normal appearance of the spinal cord and cauda equina. 2. Thoracolumbar scoliosis. 3. No neural impingement or visible inflammation. Electronically Signed   By: Marnee Spring M.D.   On: 07/03/2020 06:31        Scheduled Meds: Continuous Infusions: . dextrose 5% lactated ringers with KCl 20 mEq/L 125 mL/hr at 07/03/20 0300     LOS: 0 days   Time spent:  I have personally reviewed and interpreted on  07/03/2020 daily labs, I reviewed all nursing notes, pharmacy notes, consultant notes,  vitals, pertinent old records  I have discussed plan of care as described above with RN , patient  on 07/03/2020  Voice Recognition /Dragon dictation system was used to create this note, attempts have been made to correct errors. Please contact the author with questions and/or clarifications.   Albertine Grates, MD PhD FACP [facility]  Available via Epic secure chat 7am-7pm for nonurgent issues Please page for urgent issues To page the attending provider between 7A-7P or the covering provider during after hours 7P-7A, please  log into the web site [email or web address] and access using universal [place] password for that web site. If you do not have the password, please call the hospital operator.    07/03/2020, 8:34 AM

## 2020-07-04 DIAGNOSIS — R339 Retention of urine, unspecified: Secondary | ICD-10-CM | POA: Diagnosis not present

## 2020-07-04 DIAGNOSIS — M419 Scoliosis, unspecified: Secondary | ICD-10-CM | POA: Diagnosis present

## 2020-07-04 DIAGNOSIS — Z20822 Contact with and (suspected) exposure to covid-19: Secondary | ICD-10-CM | POA: Diagnosis present

## 2020-07-04 DIAGNOSIS — F319 Bipolar disorder, unspecified: Secondary | ICD-10-CM | POA: Diagnosis present

## 2020-07-04 DIAGNOSIS — J45909 Unspecified asthma, uncomplicated: Secondary | ICD-10-CM | POA: Diagnosis present

## 2020-07-04 DIAGNOSIS — R251 Tremor, unspecified: Secondary | ICD-10-CM | POA: Diagnosis present

## 2020-07-04 DIAGNOSIS — Z818 Family history of other mental and behavioral disorders: Secondary | ICD-10-CM | POA: Diagnosis not present

## 2020-07-04 DIAGNOSIS — K5900 Constipation, unspecified: Secondary | ICD-10-CM | POA: Diagnosis present

## 2020-07-04 DIAGNOSIS — F419 Anxiety disorder, unspecified: Secondary | ICD-10-CM | POA: Diagnosis present

## 2020-07-04 DIAGNOSIS — F4310 Post-traumatic stress disorder, unspecified: Secondary | ICD-10-CM | POA: Diagnosis present

## 2020-07-04 DIAGNOSIS — M797 Fibromyalgia: Secondary | ICD-10-CM | POA: Diagnosis present

## 2020-07-04 DIAGNOSIS — R531 Weakness: Secondary | ICD-10-CM | POA: Diagnosis present

## 2020-07-04 DIAGNOSIS — Z634 Disappearance and death of family member: Secondary | ICD-10-CM | POA: Diagnosis not present

## 2020-07-04 DIAGNOSIS — Z87891 Personal history of nicotine dependence: Secondary | ICD-10-CM | POA: Diagnosis not present

## 2020-07-04 DIAGNOSIS — E876 Hypokalemia: Secondary | ICD-10-CM | POA: Diagnosis present

## 2020-07-04 DIAGNOSIS — E610 Copper deficiency: Secondary | ICD-10-CM | POA: Diagnosis present

## 2020-07-04 DIAGNOSIS — Z87440 Personal history of urinary (tract) infections: Secondary | ICD-10-CM | POA: Diagnosis not present

## 2020-07-04 DIAGNOSIS — R338 Other retention of urine: Secondary | ICD-10-CM | POA: Diagnosis not present

## 2020-07-04 DIAGNOSIS — E559 Vitamin D deficiency, unspecified: Secondary | ICD-10-CM | POA: Diagnosis present

## 2020-07-04 LAB — URINE CULTURE: Culture: NO GROWTH

## 2020-07-04 LAB — BASIC METABOLIC PANEL
Anion gap: 10 (ref 5–15)
BUN: 19 mg/dL (ref 6–20)
CO2: 26 mmol/L (ref 22–32)
Calcium: 8.9 mg/dL (ref 8.9–10.3)
Chloride: 102 mmol/L (ref 98–111)
Creatinine, Ser: 0.95 mg/dL (ref 0.44–1.00)
GFR, Estimated: >60 mL/min (ref >60)
Glucose, Bld: 87 mg/dL (ref 70–99)
Potassium: 4 mmol/L (ref 3.5–5.1)
Sodium: 138 mmol/L (ref 135–145)

## 2020-07-04 LAB — SEDIMENTATION RATE: Sed Rate: 2 mm/hr (ref 0–22)

## 2020-07-04 LAB — B. BURGDORFI ANTIBODIES: B burgdorferi Ab IgG+IgM: <0.91 {ISR} (ref 0.00–0.90)

## 2020-07-04 MED ORDER — DULOXETINE HCL 60 MG PO CPEP
60.0000 mg | ORAL_CAPSULE | Freq: Every day | ORAL | Status: DC
  Administered 2020-07-05 – 2020-07-08 (×4): 60 mg via ORAL
  Filled 2020-07-04 (×4): qty 1

## 2020-07-04 MED ORDER — HYDROCODONE-ACETAMINOPHEN 7.5-325 MG PO TABS
1.0000 | ORAL_TABLET | ORAL | Status: DC | PRN
  Administered 2020-07-04 – 2020-07-05 (×4): 1 via ORAL
  Filled 2020-07-04 (×4): qty 1

## 2020-07-04 MED ORDER — TAMSULOSIN HCL 0.4 MG PO CAPS
0.4000 mg | ORAL_CAPSULE | Freq: Every day | ORAL | Status: DC
  Administered 2020-07-05: 11:00:00 0.4 mg via ORAL
  Filled 2020-07-04 (×2): qty 1

## 2020-07-04 MED ORDER — VITAMIN D (ERGOCALCIFEROL) 1.25 MG (50000 UNIT) PO CAPS
50000.0000 [IU] | ORAL_CAPSULE | ORAL | Status: DC
  Administered 2020-07-04: 09:00:00 50000 [IU] via ORAL
  Filled 2020-07-04: qty 1

## 2020-07-04 MED ORDER — GABAPENTIN 300 MG PO CAPS
300.0000 mg | ORAL_CAPSULE | Freq: Three times a day (TID) | ORAL | Status: DC
  Administered 2020-07-04 (×2): 300 mg via ORAL
  Filled 2020-07-04 (×2): qty 1

## 2020-07-04 MED ORDER — METHOCARBAMOL 1000 MG/10ML IJ SOLN
500.0000 mg | Freq: Four times a day (QID) | INTRAVENOUS | Status: DC | PRN
  Administered 2020-07-04 – 2020-07-05 (×2): 500 mg via INTRAVENOUS
  Filled 2020-07-04: qty 5
  Filled 2020-07-04 (×2): qty 500

## 2020-07-04 MED ORDER — METHOCARBAMOL 500 MG PO TABS
500.0000 mg | ORAL_TABLET | Freq: Four times a day (QID) | ORAL | Status: DC | PRN
  Administered 2020-07-04: 13:00:00 500 mg via ORAL
  Filled 2020-07-04: qty 1

## 2020-07-04 NOTE — Progress Notes (Signed)
MRI of thoracic and lumbar spine unremarkable except for scoliosis.   CSF with normal white count, glucose and protein.   Electronically signed: Dr. Caryl Pina

## 2020-07-04 NOTE — Consult Note (Signed)
  Patient is a 19 year old female with history of asthma, bipolar 1 disorder, PTSD/anxiety/depression, scoliosis who presents to the emergency room with complaints of syncope and weakness on all extremities, generalized pain, tingling/burning sensation.  Neurology was consulted after admission.  She underwent extensive work-up with lumbar puncture, MRI of the thoracic, lumbar spine: results are inconclusive.  We also requested psychiatry consultation today.  Psychiatric consult placed for untreated anxiety and depression now presenting with generalized pain. Did extensive work-up, no finding a clear etiology. We suspect that this is a psychiatric issue. This nurse practitioner attempted to assess patient and complete psychiatric evaluation, in which she declined at this time. Patient reports lengthy conversation with chaplain, and has now had her spirits lifted. She also reports that she is in a significant amount of pain and would like to defer this consult at this time. Did discuss with patient outpatient resources, and her ability to start her medication once she is safely discharged from the hospital. Also discussed with patient the behavioral health urgent care to [facility] on [address] in [facility], that she could present as a walk-in for same-day services to include medication management and therapy. Patient does admit to longstanding history of bipolar, and discontinuation of her medication over 5 years ago. She reports her prescribers were practicing polypharmacy at that time, and she felt it was too much for her. She reports being stable up until now when her symptoms have been exacerbated by her pain and ongoing medical conditions. She does deny any ongoing concerns and or thoughts for her safety, suicidal ideation, and or self harm behaviors. She does not appear to be an imminent risk to self or others.    -Patient declined evaluation today due to recent lengthy conversation  with chaplain that resulted in positive improvement in her feelings and spirits. She also declined due to her level of pain. -Does not meet inpatient criteria. Patient will benefit from outpatient referral to [facility] behavioral health urgent care upon discharge, and or can place referral to [facility] mental health clinic for ongoing management of services at that time.  -Patient does appear to be motivated to seek new treatment plan with psychiatric provider, would likely benefit from Cymbalta or duloxetine to help with management of depression and anxiety in addition to pain.  -Patient does not appear to be imminent risk to self or others, and denies any current or passive suicidal thoughts or ideations. -Will psychiatrically cleared patient, as her conditions can be managed in an outpatient setting.   Thank you for utilizing [place] for consult service. Please reconsult if any additional comments questions or concerns.

## 2020-07-04 NOTE — Progress Notes (Signed)
Pt with request for a pain topical cream/gel like "icy hot" for joint pain. Amnion messaged covering provider. Kristina Bean with response and new order for muscle cream. Applied to patients lower back, knees and feet/ankles.   Patient stated " the cream does help" . Call light and phone placed in reach. Frequent rounds maintained per unit protocol and MD orders.

## 2020-07-04 NOTE — Progress Notes (Signed)
Chaplain engaged in initial visit with Robynne.  During visit, chaplain learned of the different challenges Keyira has been facing.  Her mother was killed in 2018 when she also found out she was pregnant.  Maralyn described that time in her life, and her pregnancy, as being horrible and traumatic.  She believes that she has not had time to grieve her mother and has been also experiencing post-partum.  Eldonna stated that her mother is who would be her support right now if she was still alive.  Ms. Speights feels very lonely and also lacks a support system for herself and her daughter, outside of her daughter's dad.  Chaplain could assess how scary it has been for her to not know what is happening with her body and endure different health challenges while not feeling as if she has someone concrete she can depend on.    Kortney also noted that her mother had a number of significant health challenges that included seizures.  She expressed some fear around being like her mom.  Ms. Stolze was very young having conversations with her mom about her declining health and the possibility of her dying.  Delanie vocalized not wanting that to be the case for her and her daughter.  Chaplain asked Torin if she felt like she was holding those fears of being unhealthy and "like her mom" in her body? Carole strongly stated that she knows that something is wrong because of the pain that she has been in.  She can clearly tell that what is happening to her health right now is not attached to those fears and that something different is happening in her body.    Chaplain and Dalyah also discussed the nuances around being Black women within the healthcare system.  Courtland was able to share her fears and how little she shares with staff about her pain and various symptoms because of the stereotypes and ideals that exist.  Chaplain affirmed that reality and Varetta's ability and need to continue to advocate for herself.  Chaplain also continued to  offer support.    Lera was vocal about experiencing depression and anxiety.  Chaplain let her know that she could provide some resources for counseling/therapy that meets her needs.  Kyrstyn may also need to speak with social work as she has some concerns around her resources as she has been in the hospital.    Lunette Stands assesses that Calypso is in need of attentive care and nurturing.  She is in need of community and support as this time in the hospital has exacerbated some issues she was already facing.  Chaplain offered ministries of presence, listening, and prayer, and will continue to follow-up.     07/04/20 1200  Clinical Encounter Type  Visited With Patient  Visit Type Initial;Spiritual support;Social support  Stress Factors  Patient Stress Factors Health changes;Exhausted
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
## 2020-07-04 NOTE — Progress Notes (Signed)
Pt alert and aware. She was grateful for chaplain Genesis's visit. She spoke with me about alot of what she shared with the other chaplain. The chaplain offered caring and supportive presence, prayers and blessings. I took pt a bible we talk about how the word of God can give [place] strength and instruction. Pt requested further visit from spiritual care.

## 2020-07-05 LAB — CERULOPLASMIN: Ceruloplasmin: 14.5 mg/dL — ABNORMAL LOW (ref 19.0–39.0)

## 2020-07-05 LAB — MISC LABCORP TEST (SEND OUT)

## 2020-07-05 LAB — ANTI-DNA ANTIBODY, DOUBLE-STRANDED: ds DNA Ab: <1 IU/mL (ref 0–9)

## 2020-07-05 LAB — ANTINUCLEAR ANTIBODIES, IFA: ANA Ab, IFA: NEGATIVE

## 2020-07-05 LAB — RHEUMATOID FACTOR: Rheumatoid fact SerPl-aCnc: <10 IU/mL (ref <14.0)

## 2020-07-05 LAB — GLUTAMIC ACID DECARBOXYLASE AUTO ABS: Glutamic Acid Decarb Ab: <5 U/mL (ref 0.0–5.0)

## 2020-07-05 MED ORDER — DIAZEPAM 5 MG PO TABS
5.0000 mg | ORAL_TABLET | Freq: Two times a day (BID) | ORAL | Status: DC | PRN
  Administered 2020-07-05 – 2020-07-07 (×4): 5 mg via ORAL
  Filled 2020-07-05 (×5): qty 1

## 2020-07-05 MED ORDER — POLYETHYLENE GLYCOL 3350 17 G PO PACK
17.0000 g | PACK | Freq: Every day | ORAL | Status: DC
  Administered 2020-07-05: 21:00:00 17 g via ORAL
  Filled 2020-07-05: qty 1

## 2020-07-05 MED ORDER — TRAMADOL HCL 50 MG PO TABS
50.0000 mg | ORAL_TABLET | Freq: Four times a day (QID) | ORAL | Status: DC | PRN
  Administered 2020-07-05 – 2020-07-06 (×4): 50 mg via ORAL
  Filled 2020-07-05 (×3): qty 1

## 2020-07-05 MED ORDER — SENNA 8.6 MG PO TABS
1.0000 | ORAL_TABLET | Freq: Every evening | ORAL | Status: DC | PRN
  Administered 2020-07-06: 01:00:00 8.6 mg via ORAL
  Filled 2020-07-05: qty 1

## 2020-07-05 MED ORDER — ENSURE ENLIVE PO LIQD
237.0000 mL | Freq: Three times a day (TID) | ORAL | Status: DC
  Administered 2020-07-06 – 2020-07-08 (×8): 237 mL via ORAL

## 2020-07-05 MED ORDER — HYDROMORPHONE HCL 1 MG/ML IJ SOLN
0.5000 mg | Freq: Once | INTRAMUSCULAR | Status: AC
  Administered 2020-07-05: 16:00:00 0.5 mg via INTRAVENOUS
  Filled 2020-07-05: qty 0.5

## 2020-07-05 MED ORDER — METHOCARBAMOL 500 MG PO TABS
500.0000 mg | ORAL_TABLET | Freq: Four times a day (QID) | ORAL | Status: DC | PRN
  Administered 2020-07-05 – 2020-07-06 (×3): 500 mg via ORAL
  Filled 2020-07-05 (×3): qty 1

## 2020-07-05 MED ORDER — ADULT MULTIVITAMIN W/MINERALS CH
1.0000 | ORAL_TABLET | Freq: Every day | ORAL | Status: DC
  Administered 2020-07-05 – 2020-07-08 (×4): 1 via ORAL
  Filled 2020-07-05 (×4): qty 1

## 2020-07-05 NOTE — Plan of Care (Signed)

## 2020-07-05 NOTE — Progress Notes (Signed)
Chaplain engaged in follow-up visit with Kristina Bean continuing to offer support and ministries of listening and presence.  Chaplain affirmed Kristina Bean's right and power to advocate for herself and what she needs and desires as a patient.    Chaplain will continue to follow-up.
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
## 2020-07-05 NOTE — Progress Notes (Signed)
Initial Nutrition Assessment  RD working remotely.  DOCUMENTATION CODES:   Not applicable  INTERVENTION:   - Ensure Enlive po TID, each supplement provides 350 kcal and 20 grams of protein  - MVI with minerals daily  - Encourage adequate PO intake  NUTRITION DIAGNOSIS:   Inadequate oral intake related to decreased appetite,nausea as evidenced by per patient/family report.  GOAL:   Patient will meet greater than or equal to 90% of their needs  MONITOR:   PO intake,Supplement acceptance,Weight trends  REASON FOR ASSESSMENT:        ASSESSMENT:   19 year old female who presented to the ED on 12/25 with back pain. PMH of asthma, bipolar 1 disorder, PTSD, anxiety, depression, scoliosis.   Per notes, pt's presentation is consistent with fibromyalgia. Neurology has signed off.  Spoke with pt via phone call to room. Pt reports that she is in a lot of pain at time of RD phone call so she is unable to talk for long.  Pt reports poor PO intake for about a week PTA due to pain. Pt states that since admission she has been trying to eat more. She states that she ate well for breakfast but hasn't yet started on her lunch. Pt states that the pain medication that she is on is making her feel nauseous. Pt willing to try oral nutrition supplements to aid in meeting kcal and protein needs. RD to order Ensure along with a daily MVI with minerals.  Pt reports that her UBW is 153 lbs. She states that she had gained some weight due to "eating a lot." Pt states that she then lost the weight that she had gained due to being sick and not eating as much. She is unsure what weight she was at prior to losing weight because she does not have a scale at home.  Reviewed weight history in chart. Weight stable compared to weights from July and September 2020.  Meal Completion: 0-100% x last 6 recorded meals  Medications reviewed and include: vitamin D weekly  Labs reviewed.  UOP: 1050 ml x 24  hours  NUTRITION - FOCUSED PHYSICAL EXAM:  Unable to complete at this time. RD working remotely.  Diet Order:   Diet Order            Diet regular Room service appropriate? Yes; Fluid consistency: Thin  Diet effective now                 EDUCATION NEEDS:   No education needs have been identified at this time  Skin:  Skin Assessment: Reviewed RN Assessment  Last BM:  07/04/20  Height:   Ht Readings from Last 1 Encounters:  07/03/20 5' 7.01" (1.702 m)    Weight:   Wt Readings from Last 1 Encounters:  07/03/20 69 kg    BMI:  Body mass index is 23.82 kg/m.  Estimated Nutritional Needs:   Kcal:  1850-2050  Protein:  85-100 grams  Fluid:  1.8-2.0 L    Mertie Clause, MS, RD, LDN Inpatient Clinical Dietitian Please see AMiON for contact information.

## 2020-07-05 NOTE — Evaluation (Signed)
Physical Therapy Evaluation Patient Details Name: Kristina Bean MRN: [id number] DOB: [birth year] Today's Date: 07/05/2020   History of Present Illness  19 year old female with history of asthma, bipolar 1 disorder, PTSD/anxiety/depression, scoliosis who presents to the emergency room with complaints of syncope and weakness on all extremities, generalized pain, tingling/burning sensation.  Neurology was consulted after admission.  She underwent extensive work-up with lumbar puncture, MRI of the thoracic, lumbar spine: results are inconclusive.  Psychiatry consultation also requested.  Clinical Impression  Pt admitted with above diagnosis.  Pt currently with functional limitations due to the deficits listed below (see PT Problem List). Pt will benefit from skilled PT to increase their independence and safety with mobility to allow discharge to the venue listed below.  Pt on BSC upon entering room and agreeable to work with OT and PT. Pt reports pain in her joints and also one week hx of numbness and tingling in her legs (right > left).  Pt performed a couple sit to stands with mod +2 assist and took a few steps backwards to recliner.  Pt encouraged to be OOB for meals.  Pt hopeful to ambulate without assistive device prior to d/c to be able to more easily take care of her daughter.      Follow Up Recommendations Home health PT    Equipment Recommendations  Rolling walker with 5" wheels    Recommendations for Other Services       Precautions / Restrictions Precautions Precautions: Fall      Mobility  Bed Mobility               General bed mobility comments: OOB during session    Transfers Overall transfer level: Needs assistance Equipment used: Rolling walker (2 wheeled) Transfers: Sit to/from Stand Sit to Stand: Mod assist;+2 safety/equipment;+2 physical assistance         General transfer comment: sit<>stand 2x from [facility] then took a couple of steps backwards to access  recliner. Mod A to steady and powerup. +2 for safety utilized  Ambulation/Gait             General Gait Details: pt declined today  Information systems manager [employer] (Stroke Patients Only)       Balance Overall balance assessment: Needs assistance         Standing balance support: Bilateral upper extremity supported;Single extremity supported;During functional activity Standing balance-Leahy Scale: Poor Standing balance comment: requires UE support                             Pertinent Vitals/Pain Pain Assessment: Faces Faces Pain Scale: Hurts even more Pain Location: BLE>BUE, back. worse in standing Pain Descriptors / Indicators: Grimacing;Moaning Pain Intervention(s): Premedicated before session;Monitored during session;Repositioned    Home Living Family/patient expects to be discharged to:: Private residence Living Arrangements: Spouse/significant other               Additional Comments: lives with boyfriend and 2 yo daughter    Prior Function Level of Independence: Independent               Higher education careers adviser        Extremity/Trunk Assessment        Lower Extremity Assessment Lower Extremity Assessment: Generalized weakness;RLE deficits/detail RLE Deficits / Details: reports increased numbness and tingling for approx one week, states sometimes her leg goes completely numb and  she cannot mobilize       Communication   Communication: No difficulties  Cognition Arousal/Alertness: Awake/alert Behavior During Therapy: Anxious;Flat affect (tearful at times) Overall Cognitive Status: Within Functional Limits for tasks assessed                                 General Comments: internally distracted. very anxious. difficulty sequencing with walker while having increased anxiety in standing position      General Comments      Exercises     Assessment/Plan    PT Assessment Patient  needs continued PT services  PT Problem List Decreased strength;Decreased mobility;Decreased balance;Decreased knowledge of use of DME;Decreased activity tolerance       PT Treatment Interventions DME instruction;Gait training;Balance training;Functional mobility training;Therapeutic activities;Patient/family education;Therapeutic exercise;Stair training    PT Goals (Current goals can be found in the Care Plan section)  Acute Rehab PT Goals Patient Stated Goal: ambulate without walker PT Goal Formulation: With patient Time For Goal Achievement: 07/19/20    Frequency Min 3X/week   Barriers to discharge        Co-evaluation PT/OT/SLP Co-Evaluation/Treatment: Yes Reason for Co-Treatment: Necessary to address cognition/behavior during functional activity;For patient/therapist safety;To address functional/ADL transfers PT goals addressed during session: Mobility/safety with mobility OT goals addressed during session: ADL's and self-care       AM-PAC PT "6 Clicks" Mobility  Outcome Measure Help needed turning from your back to your side while in a flat bed without using bedrails?: A Little Help needed moving from lying on your back to sitting on the side of a flat bed without using bedrails?: A Little Help needed moving to and from a bed to a chair (including a wheelchair)?: A Lot Help needed standing up from a chair using your arms (e.g., wheelchair or bedside chair)?: A Lot Help needed to walk in hospital room?: A Lot Help needed climbing 3-5 steps with a railing? : Total 6 Click Score: 13    End of Session Equipment Utilized During Treatment: Gait belt Activity Tolerance: Patient tolerated treatment well Patient left: in chair;with call bell/phone within reach;with chair alarm set   PT Visit Diagnosis: Other abnormalities of gait and mobility (R26.89)    Time: 5638-9373 PT Time Calculation (min) (ACUTE ONLY): 28 min   Charges:   PT Evaluation $PT Eval Low Complexity: 1  Low        Kati PT, DPT Acute Rehabilitation Services Pager: [phone] Office: [phone]  Maida Sale E 07/05/2020, 1:18 PM

## 2020-07-05 NOTE — Progress Notes (Signed)
Assisted pt to [facility] . Noted Blood tinged urine. Asked patient was her menstrual cycle due. Patient replied it maybe early. Assisted patient with Bath, sheets and linens changed. Applied topical pain cream to knees and joints. Patient with episodes of sadness and tearful at times during the shift. Emotional support given to patient.  Patient with continued frequent request for B/L joint  pain rated 9/10. See mar for prns given. Call light and phone in reach. Frequent verbal contacts and reassure made per unit protocol.  Continued prn med schedule on white board per day shift instruction.

## 2020-07-05 NOTE — Evaluation (Signed)
Occupational Therapy Evaluation Patient Details Name: Kristina Bean MRN: [id number] DOB: 05-Jul-2001 Today's Date: 07/05/2020    History of Present Illness 19 year old female with history of asthma, bipolar 1 disorder, PTSD/anxiety/depression, scoliosis who presents to the emergency room with complaints of syncope and weakness on all extremities, generalized pain, tingling/burning sensation.  Neurology was consulted after admission.  She underwent extensive work-up with lumbar puncture, MRI of the thoracic, lumbar spine: results are inconclusive.  Psychiatry consultation also requested.   Clinical Impression   Pt admitted with the above diagnoses and presents with below problem list. Pt will benefit from continued acute OT to address the below listed deficits and maximize independence with basic ADLs prior to d/c home. PTA pt was independent with ADLs, lives with her boyfriend and 2 yo daughter. Pt presents with impaired balance, decreased activity tolerance, and r/o pain/tingly in all extremities, worse in standing position. Pt noted to be very anxious throughout session but agreeable to basic OT/PT therapy evaluations (seen together). Pt currently mod A +2 with LB ADLs and functional transfers. Unable to progress mobility this session beyond taking a couple of steps backwards using rw 2/2 increased pain and anxiety in standing position. Pt up in recliner at end of session, blinds partially open. Discussed goal of being up to chair for meals and some general BUE/BLE strengthening/AROM exercises to do on her own, to toleration between rehab sessions. Pt reports she likes to keep to the same routine she has at home with basic ADLs.      Follow Up Recommendations  Home health OT;Supervision/Assistance - 24 hour (initially 24 hour supervision)    Equipment Recommendations  3 in 1 bedside commode    Recommendations for Other Services       Precautions / Restrictions Precautions Precautions:  Fall Restrictions Weight Bearing Restrictions: No      Mobility Bed Mobility               General bed mobility comments: OOB during session    Transfers Overall transfer level: Needs assistance Equipment used: Rolling walker (2 wheeled) Transfers: Sit to/from Stand Sit to Stand: Mod assist;+2 safety/equipment;+2 physical assistance         General transfer comment: sit<>stand 2x from [facility] then took a couple of steps backwards to access recliner. Mod A to steady and powerup. +2 for safety utilized    Balance Overall balance assessment: Needs assistance Sitting-balance support: No upper extremity supported;Feet supported Sitting balance-Leahy Scale: Fair   Postural control: Posterior lean Standing balance support: Bilateral upper extremity supported;Single extremity supported;During functional activity Standing balance-Leahy Scale: Poor Standing balance comment: rw and up to mod A for static standing                           ADL either performed or assessed with clinical judgement   ADL Overall ADL's : Needs assistance/impaired Eating/Feeding: Set up   Grooming: Set up   Upper Body Bathing: Set up;Sitting   Lower Body Bathing: Moderate assistance;Sit to/from stand;+2 for safety/equipment;+2 for physical assistance   Upper Body Dressing : Set up;Sitting   Lower Body Dressing: Moderate assistance;Sit to/from stand;+2 for safety/equipment;+2 for physical assistance   Toilet Transfer: Moderate assistance;+2 for safety/equipment;Stand-pivot;BSC;RW;+2 for physical assistance Toilet Transfer Details (indicate cue type and reason): Pt received on BSC. Able to take small steps forward and back using rw. +2 for safety, increased anxiety and LOB, mod A to steady. Toileting- Clothing Manipulation and Hygiene: Moderate assistance;Sit  to/from stand;+2 for safety/equipment;+2 for physical assistance Toileting - Clothing Manipulation Details (indicate cue type and  reason): assist to steady       General ADL Comments: Pt received on BSC. LB dressing with mod A to steady. Pt then took a couple of steps backwards to access recliner. Used rw. Pt needing mod A +2 safety to mobilize away from EOB.     Vision   Additional Comments: did not fully assess this session. Appears functionally to have no change.     Perception     Praxis      Pertinent Vitals/Pain Pain Assessment: Faces Faces Pain Scale: Hurts even more Pain Location: BLE>BUE, back. worse in standing Pain Descriptors / Indicators: Grimacing;Moaning     Hand Dominance     Extremity/Trunk Assessment Upper Extremity Assessment Upper Extremity Assessment: RUE deficits/detail;LUE deficits/detail RUE Deficits / Details: WFL AROM for functional tasks assessed this session. C/o tingling and pain in weightbearing position (hands on walker).  tremulous at times during functional use. LUE Deficits / Details: WFL AROM for functional tasks assessed this session. C/o tingling and pain in weightbearing position (hands on walker). tremulous at times during functional use.   Lower Extremity Assessment Lower Extremity Assessment: Defer to PT evaluation       Communication Communication Communication: No difficulties   Cognition Arousal/Alertness: Awake/alert Behavior During Therapy: Anxious;Flat affect (tearful at times) Overall Cognitive Status: Within Functional Limits for tasks assessed                                 General Comments: internally distracted. very anxious. difficulty sequencing with walker while having increased anxiety in standing position   General Comments       Exercises Exercises: Other exercises Other Exercises Other Exercises: discussed general AROM BUE/BLE exercises and light resistance (BUE) as tolerated.   Shoulder Instructions      Home Living Family/patient expects to be discharged to:: Private residence Living Arrangements:  Spouse/significant other                               Additional Comments: lives with boyfriend and 2 yo daughter      Prior Functioning/Environment Level of Independence: Independent                 OT Problem List: Decreased activity tolerance;Impaired balance (sitting and/or standing);Decreased coordination;Decreased knowledge of use of DME or AE;Decreased knowledge of precautions;Decreased safety awareness;Impaired sensation;Pain;Impaired UE functional use      OT Treatment/Interventions: Self-care/ADL training;Therapeutic exercise;Neuromuscular education;DME and/or AE instruction;Therapeutic activities;Patient/family education;Balance training    OT Goals(Current goals can be found in the care plan section) Acute Rehab OT Goals Patient Stated Goal: to move without hurting OT Goal Formulation: With patient Time For Goal Achievement: 07/19/20 Potential to Achieve Goals: Good ADL Goals Pt Will Perform Lower Body Bathing: with modified independence;sit to/from stand Pt Will Perform Lower Body Dressing: with modified independence;sit to/from stand Pt Will Transfer to Toilet: with modified independence;ambulating Pt Will Perform Toileting - Clothing Manipulation and hygiene: with modified independence;sit to/from stand Pt/caregiver will Perform Home Exercise Program: Increased strength;Both right and left upper extremity;With theraband;Independently;With written HEP provided  OT Frequency: Min 2X/week   Barriers to D/C:            Co-evaluation PT/OT/SLP Co-Evaluation/Treatment: Yes Reason for Co-Treatment: Necessary to address cognition/behavior during functional activity;For patient/therapist safety;To address  functional/ADL transfers   OT goals addressed during session: ADL's and self-care      AM-PAC OT "6 Clicks" Daily Activity     Outcome Measure Help from another person eating meals?: None Help from another person taking care of personal grooming?:  None Help from another person toileting, which includes using toliet, bedpan, or urinal?: A Lot Help from another person bathing (including washing, rinsing, drying)?: A Lot Help from another person to put on and taking off regular upper body clothing?: A Little Help from another person to put on and taking off regular lower body clothing?: A Lot 6 Click Score: 17   End of Session Equipment Utilized During Treatment: Rolling walker  Activity Tolerance: Patient limited by pain;Other (comment) (anxiety) Patient left: in chair;with call bell/phone within reach;with chair alarm set  OT Visit Diagnosis: Unsteadiness on feet (R26.81);Other abnormalities of gait and mobility (R26.89);Pain;Other symptoms and signs involving the nervous system (R29.898)                Time: 5885-0277 OT Time Calculation (min): 30 min Charges:  OT General Charges $OT Visit: 1 Visit OT Evaluation $OT Eval Moderate Complexity: 1 Mod  Raynald Kemp, OT Acute Rehabilitation Services Pager: [phone] Office: [phone]   Pilar Grammes 07/05/2020, 10:33 AM

## 2020-07-05 NOTE — Consult Note (Addendum)
[facility]   Reason for Consult:  Psychosomatic pain Referring Physician:  Dr. Renford DillsAdhikari Patient Identification: Kristina Bean MRN:  [id number] Principal Diagnosis: Weakness Diagnosis:  Principal Problem:   Weakness   Total Time spent with patient: 45 minutes  Subjective:   Kristina Bean is a 19 y.o. female patient admitted with complaints of syncope, weakness in all extremities, generalized pain, tingling and burning sensation.  Psych was consulted due to suspected psychosomatic condition, as extensive neurological work-up has been completed and determined to be within normal limits.  Kristina Bean is a 19 year old female who moved from [place] to [place], with her almost 21-year-old daughter. On evaluation patient does endorse symptoms of depression, anxiety, decreased concentration, forgetfulness, mood swings and irritability that have been impacting her daily life.  She also reports some ongoing stressors that include unresolved grief due to her mother's murder in 2018, subsequently followed by her pregnancy.  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  She is currently not seeing any outpatient psychiatrist and or therapist, however does show some insight into ongoing behavioral health concerns.  She reports previously taking multiple psychotropic medications to include Zoloft, Seroquel, trazodone, Abilify and others.  She states" when I was in high school I was taking 5 pills a day at school and always felt like a zombie.  I was a ward of the state therefore I did not have much to say so over which medication that took."  She now reports that she has since educated herself on her ongoing conditions and she is interested in now seeking help as she realizes it is impairing her ability to function at work, be a mother, and at home.  She does acknowledge that she has a history of depression bipolar and anxiety however she reports her  pain is new despite having the same diagnosis previously.  She is open to treatment and medication, although she still notes to find resolution for her pain.  She denies any suicidal ideations, homicidal ideations, and or auditory visual hallucinations.  She answers all questions appropriately and does not appear to be responding to internal stimuli at this time.  HPI:  Patient is a 19 year old female with history of asthma, bipolar 1 disorder, PTSD/anxiety/depression, scoliosis who presents to the emergency room with complaints of syncope and weakness on all extremities, generalized pain, tingling/burning sensation.  Neurology was consulted after admission.  She underwent extensive work-up with lumbar puncture, MRI of the thoracic, lumbar spine: results are inconclusive.  We also requested psychiatry consultation .  Patient's presentation is consistent with fibromyalgia.   During the evaluation patient is alert and oriented, calm and cooperative, very soft-spoken and well informed.  Upon entry into the room patient is observed to be ending a phone call with the dietitian, she is also found to have company in whom she identifies as her younger brother and her older brother is on a face time video while in the room.  This Clinical research associatewriter introduced herself in addition to [employer] Services[employer]ennifer Johnson unit director, and express reason for visit.  Writer reviewed ongoing concerns, likelihood of psychosomatic conditions and the goal to treat while in the hospital.  It was at that time that her brother identified as Kristina Bean became very defensive, argumentative, and disruptive.  We asked at that time the face time call be ended at this time due to this actions.  Patient was then observed to be crying, however advocating for herself to someone named [name]  who had since taking over the face time call.  Writer did acknowledge a congratulate patient for being a advocate and speaking up for herself during this time of high and anxiety of  frustration.  Writer was then able to proceed with the rest of the evaluation at that time, consent was obtained to continue with the presence of her brother Kristina Bean at the bedside.  Patient does endorse some symptoms consistent with depression as well and mood disorder.  She presents today with logical yet tangential thoughts, rumination, and circumstantial speech.  We did review in detail our plan at this time, in which she requests referrals to preferred African-American female providers to include therapist as well as psychiatrist.  She states she feels as though they will be able to understand and can relate better to what she has going on.  Writer did acknowledge concerns and verbalized understanding, however she is advised that she may have to seek services through telepsychiatry in order to obtain a African-American female.  She denies any suicidal ideations, homicidal ideations, and or auditory visual hallucinations.   Her brother Kristina Bean who is also at the bedside does provide extensive collateral information to include family history.  Per Kristina Bean he does report extensive history of bipolar disease throughout the family particularly mother being diagnosed with " borderline schizophrenia, bipolar disorder", mild daughter also can lead moods and she is only 21 years old.  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  As you can see my other brother Kristina Bean has anger issues, and likely bipolar disorder as well.   Past Psychiatric History: See above  Risk to Self:  Denies Risk to Others:  Denies Prior Inpatient Therapy:   Prior Outpatient Therapy:    Past Medical History:  Past Medical History:  Diagnosis Date  . Anxiety   . Asthma   . Bipolar 1 disorder (HCC)   . Depression   . Miscarriage   . Scoliosis    History reviewed. No pertinent surgical history. Family History:  Family History  Problem  Relation Age of Onset  . Breast cancer Mother   . Thyroid cancer Mother   . Colon cancer Mother   . Diabetes Mother   . Heart disease Mother    Family Psychiatric  History: See above per brother extensive family history of schizophrenia and bipolar disorder.  Per patient her father is also undiagnosed bipolar, personality disorder, and OCD.  Social History:  Social History   Substance and Sexual Activity  Alcohol Use Yes   Comment: Occasional shot of liquor     Social History   Substance and Sexual Activity  Drug Use Never    Social History   Socioeconomic History  . Marital status: Single    Spouse name: Not on file  . Number of children: Not on file  . Years of education: Not on file  . Highest education level: Not on file  Occupational History  . Not on file  Tobacco Use  . Smoking status: Former Smoker    Quit date: 05/13/2020    Years since quitting: 0.1  . Smokeless tobacco: Former Neurosurgeon  . Tobacco comment: 1 cigarette every few weeks  Substance and Sexual Activity  . Alcohol use: Yes    Comment: Occasional shot of liquor  . Drug use: Never  . Sexual activity: Yes    Birth control/protection: None  Other Topics Concern  . Not  on file  Social History Narrative  . Not on file   Social Determinants of Health   Financial Resource Strain: Not on file  Food Insecurity: Not on file  Transportation Needs: Not on file  Physical Activity: Not on file  Stress: Not on file  Social Connections: Not on file   Additional Social History:    Allergies:  No Known Allergies  Labs:  Results for orders placed or performed during the hospital encounter of 07/02/20 (from the past 48 hour(s))  Ceruloplasmin     Status: Abnormal   Collection Time: 07/04/20  5:00 AM  Result Value Ref Range   Ceruloplasmin 14.5 (L) 19.0 - 39.0 mg/dL    Comment: (NOTE) Performed At: [facility] [address] [place], [place] [id number] Jolene Schimke MD WU:[id number]   ANA,  IFA (with reflex)     Status: None   Collection Time: 07/04/20  5:00 AM  Result Value Ref Range   ANA Ab, IFA Negative     Comment: (NOTE)                                     Negative   <1:80                                     Borderline  1:80                                     Positive   >1:80 ICAP nomenclature: AC-0 For more information about Hep-2 cell patterns use ANApatterns.org, the official website for the International Consensus on Antinuclear Antibody (ANA) Patterns (ICAP). Performed At: [facility] [address] [place], [place] [id number] Jolene Schimke MD VH:[id number]   Anti-DNA antibody, double-stranded     Status: None   Collection Time: 07/04/20  5:00 AM  Result Value Ref Range   ds DNA Ab <1 0 - 9 IU/mL    Comment: (NOTE)                                   Negative      <5                                   Equivocal  5 - 9                                   Positive      >9 Performed At: [facility] [address] [place], [place] [id number] Jolene Schimke MD UV:[id number]   Rheumatoid factor     Status: None   Collection Time: 07/04/20  5:00 AM  Result Value Ref Range   Rhuematoid fact SerPl-aCnc <10.0 <14.0 IU/mL    Comment: (NOTE) Performed At: [facility] [address] [place], [place] [id number] Jolene Schimke MD VF:[id number]   Sedimentation rate     Status: None   Collection Time: 07/04/20  5:00 AM  Result Value Ref Range   Sed Rate 2 0 - 22 mm/hr    Comment: Performed at [employer] [facility], [address] [place], [place] [address]  Basic metabolic panel     Status: None   Collection Time: 07/04/20  6:44 AM  Result Value Ref Range   Sodium 138 135 - 145 mmol/L   Potassium 4.0 3.5 - 5.1 mmol/L    Comment: DELTA CHECK NOTED NO VISIBLE HEMOLYSIS    Chloride 102 98 - 111 mmol/L   CO2 26 22 - 32 mmol/L   Glucose, Bld 87 70 - 99 mg/dL    Comment: Glucose reference range applies only to samples taken  after fasting for at least 8 hours.   BUN 19 6 - 20 mg/dL   Creatinine, Ser 8.46 0.44 - 1.00 mg/dL   Calcium 8.9 8.9 - 96.2 mg/dL   GFR, Estimated >95 >28 mL/min    Comment: (NOTE) Calculated using the CKD-EPI Creatinine Equation (2021)    Anion gap 10 5 - 15    Comment: Performed at [facility], [address], [place], [place] [address]    Current Facility-Administered Medications  Medication Dose Route Frequency Provider Last Rate Last Admin  . acetaminophen (TYLENOL) tablet 1,000 mg  1,000 mg Oral Q6H PRN Charlsie Quest, MD   1,000 mg at 07/05/20 1449  . albuterol (VENTOLIN HFA) 108 (90 Base) MCG/ACT inhaler 1-2 puff  1-2 puff Inhalation Q6H PRN Darreld Mclean R, MD      . diazepam (VALIUM) tablet 5 mg  5 mg Oral Q12H PRN Maryagnes Amos, FNP      . DULoxetine (CYMBALTA) DR capsule 60 mg  60 mg Oral Daily Burnadette Pop, MD   60 mg at 07/05/20 1103  . feeding supplement (ENSURE ENLIVE / ENSURE PLUS) liquid 237 mL  237 mL Oral TID BM Adhikari, Amrit, MD      . hydrOXYzine (ATARAX/VISTARIL) tablet 10 mg  10 mg Oral TID PRN Charlsie Quest, MD   10 mg at 07/05/20 1110  . methocarbamol (ROBAXIN) tablet 500 mg  500 mg Oral Q6H PRN Burnadette Pop, MD   500 mg at 07/05/20 1104  . multivitamin with minerals tablet 1 tablet  1 tablet Oral Daily Burnadette Pop, MD      . Muscle Rub CREA   Topical PRN Manuela Schwartz, NP   Given at 07/05/20 0450  . ondansetron (ZOFRAN) tablet 4 mg  4 mg Oral Q6H PRN Charlsie Quest, MD       Or  . ondansetron (ZOFRAN) injection 4 mg  4 mg Intravenous Q6H PRN Charlsie Quest, MD   4 mg at 07/03/20 0256  . tamsulosin (FLOMAX) capsule 0.4 mg  0.4 mg Oral Daily Burnadette Pop, MD   0.4 mg at 07/05/20 1103  . traMADol (ULTRAM) tablet 50 mg  50 mg Oral Q6H PRN Burnadette Pop, MD   50 mg at 07/05/20 1103  . Vitamin D (Ergocalciferol) (DRISDOL) capsule 50,000 Units  50,000 Units Oral Q7 days Burnadette Pop, MD   50,000 Units at 07/04/20  0920    Musculoskeletal: Strength & Muscle Tone: decreased Gait & Station: normal Patient leans: N/A  Psychiatric Specialty Exam: Physical Exam  Review of Systems  Blood pressure (!) 112/58, pulse 72, temperature 98.1 F (36.7 C), temperature source Oral, resp. rate 14, height 5' 7.01" (1.702 m), weight 69 kg, last menstrual period 06/10/2020, SpO2 98 %, unknown if currently breastfeeding.Body mass index is 23.82 kg/m.  General Appearance: Casual and Fairly Groomed  Eye Contact:  Good  Speech:  Clear and Coherent and  Normal Rate  Volume:  Decreased  Mood:  Anxious and Depressed  Affect:  Congruent, Depressed, Flat and Tearful  Thought Process:  Coherent, Linear and Descriptions of Associations: Circumstantial  Orientation:  Full (Time, Place, and Person)  Thought Content:  WDL and Tangential  Suicidal Thoughts:  No  Homicidal Thoughts:  No  Memory:  Immediate;   Fair Recent;   Fair Remote;   Fair  Judgement:  Fair  Insight:  Fair  Psychomotor Activity:  Psychomotor Retardation  Concentration:  Concentration: Fair and Attention Span: Good  Recall:  Fair  Fund of Knowledge:  Good  Language:  Good  Akathisia:  No  Handed:  Right  AIMS (if indicated):     Assets:  Communication Skills Desire for Improvement Financial Resources/Insurance Housing Intimacy Leisure Time Physical Health Social Support  ADL's:  Intact  Cognition:  WNL  Sleep:        Treatment Plan Summary: Plan Her current medical provider has started Cymbalta, this was the medication that we did agree on.  Explained to patient that when prescribed at higher levels it can help to manage pain fibromyalgia as well as treat cooccurring depression and anxiety.  Did acknowledge patient's pain being subjective, compared to objective findings per her medical team and how her goal is to treat her depression and anxiety, and thus lower her pain threshold.  While in the room she was complaining of muscle aches,  cramps, and intermittent sharp pains.  Verbal order was given for Valium 5 mg p.o. twice daily as needed for anxiety, muscle aches, and cramps.  Did discuss with patient that this medication is a scheduled to controlled substance therefore we will likely not prescribe this medication to her upon discharge and if so it will only be for 5 days.  She is instructed to follow-up with outpatient psychiatry to ensure ongoing management of these medications. Patient does not meet inpatient criteria at this time, and is appropriate to follow-up with outpatient psychiatry as noted below.  Does not meet criteria for involuntary commitment at this time, as she does not appear to be at risk to herself or others. -Valium 5 mg p.o. twice daily as needed for anxiety, muscle aches, and cramps. -Previously discussed starting Cymbalta however this medication has been initiated by her medical team for treatment of fibromyalgia. -Please work with social work to facilitate referral to therapist, as noted patient has a preference of African-American female providers.  Did discuss with patient Donell Sievert, [facility] at [facility] who is accepting new patients in the area however this is a female provider.  She again verbalizes understanding and will work with social work to facilitate and coordinate appropriate treatment follow-up.  Disposition: No evidence of imminent risk to self or others at present.   Patient does not meet criteria for psychiatric inpatient admission. Supportive therapy provided about ongoing stressors. Refer to IOP. Discussed crisis plan, support from social network, calling 911, coming to the Emergency Department, and calling Suicide Hotline.  Maryagnes Amos, FNP 07/05/2020 3:06 PM

## 2020-07-05 NOTE — Progress Notes (Signed)
Writer attempted to assess and evaluate patient twice and she was performing hygiene and ADLs will attempt to reassess her at a later time.

## 2020-07-06 DIAGNOSIS — E559 Vitamin D deficiency, unspecified: Secondary | ICD-10-CM | POA: Diagnosis present

## 2020-07-06 DIAGNOSIS — F319 Bipolar disorder, unspecified: Secondary | ICD-10-CM

## 2020-07-06 DIAGNOSIS — F419 Anxiety disorder, unspecified: Secondary | ICD-10-CM | POA: Diagnosis present

## 2020-07-06 DIAGNOSIS — F32A Depression, unspecified: Secondary | ICD-10-CM

## 2020-07-06 DIAGNOSIS — K5900 Constipation, unspecified: Secondary | ICD-10-CM | POA: Diagnosis present

## 2020-07-06 DIAGNOSIS — E876 Hypokalemia: Secondary | ICD-10-CM

## 2020-07-06 DIAGNOSIS — R338 Other retention of urine: Secondary | ICD-10-CM

## 2020-07-06 LAB — CBC WITH DIFFERENTIAL/PLATELET
Abs Immature Granulocytes: 0.01 10*3/uL (ref 0.00–0.07)
Basophils Absolute: 0 10*3/uL (ref 0.0–0.1)
Basophils Relative: 1 %
Eosinophils Absolute: 0.3 10*3/uL (ref 0.0–0.5)
Eosinophils Relative: 5 %
HCT: 43.8 % (ref 36.0–46.0)
Hemoglobin: 15 g/dL (ref 12.0–15.0)
Immature Granulocytes: 0 %
Lymphocytes Relative: 34 %
Lymphs Abs: 1.8 10*3/uL (ref 0.7–4.0)
MCH: 32.2 pg (ref 26.0–34.0)
MCHC: 34.2 g/dL (ref 30.0–36.0)
MCV: 94 fL (ref 80.0–100.0)
Monocytes Absolute: 0.4 10*3/uL (ref 0.1–1.0)
Monocytes Relative: 8 %
Neutro Abs: 2.8 10*3/uL (ref 1.7–7.7)
Neutrophils Relative %: 52 %
Platelets: 298 10*3/uL (ref 150–400)
RBC: 4.66 MIL/uL (ref 3.87–5.11)
RDW: 11.8 % (ref 11.5–15.5)
WBC: 5.3 10*3/uL (ref 4.0–10.5)
nRBC: 0 % (ref 0.0–0.2)

## 2020-07-06 LAB — CSF CULTURE W GRAM STAIN
Culture: NO GROWTH
Gram Stain: NONE SEEN

## 2020-07-06 LAB — ZINC: Zinc: 77 ug/dL (ref 44–115)

## 2020-07-06 LAB — URIC ACID: Uric Acid, Serum: 3.6 mg/dL (ref 2.5–7.1)

## 2020-07-06 LAB — RENAL FUNCTION PANEL
Albumin: 4.1 g/dL (ref 3.5–5.0)
Anion gap: 9 (ref 5–15)
BUN: 16 mg/dL (ref 6–20)
CO2: 26 mmol/L (ref 22–32)
Calcium: 9.6 mg/dL (ref 8.9–10.3)
Chloride: 100 mmol/L (ref 98–111)
Creatinine, Ser: 0.92 mg/dL (ref 0.44–1.00)
GFR, Estimated: >60 mL/min (ref >60)
Glucose, Bld: 90 mg/dL (ref 70–99)
Phosphorus: 3.6 mg/dL (ref 2.5–4.6)
Potassium: 4.1 mmol/L (ref 3.5–5.1)
Sodium: 135 mmol/L (ref 135–145)

## 2020-07-06 LAB — ANGIOTENSIN CONVERTING ENZYME: Angiotensin-Converting Enzyme: 29 U/L (ref 14–82)

## 2020-07-06 LAB — COPPER, SERUM: Copper: 67 ug/dL — ABNORMAL LOW (ref 80–158)

## 2020-07-06 LAB — VDRL, CSF: VDRL Quant, CSF: NONREACTIVE

## 2020-07-06 LAB — MAGNESIUM: Magnesium: 2.1 mg/dL (ref 1.7–2.4)

## 2020-07-06 LAB — GLUCOSE, CAPILLARY: Glucose-Capillary: 105 mg/dL — ABNORMAL HIGH (ref 70–99)

## 2020-07-06 MED ORDER — SENNOSIDES-DOCUSATE SODIUM 8.6-50 MG PO TABS
1.0000 | ORAL_TABLET | Freq: Two times a day (BID) | ORAL | Status: DC
  Administered 2020-07-06 – 2020-07-08 (×5): 1 via ORAL
  Filled 2020-07-06 (×5): qty 1

## 2020-07-06 MED ORDER — METHYLPREDNISOLONE SODIUM SUCC 125 MG IJ SOLR
60.0000 mg | Freq: Once | INTRAMUSCULAR | Status: AC
  Administered 2020-07-06: 23:00:00 60 mg via INTRAVENOUS
  Filled 2020-07-06: qty 2

## 2020-07-06 MED ORDER — METHOCARBAMOL 500 MG PO TABS
750.0000 mg | ORAL_TABLET | Freq: Four times a day (QID) | ORAL | Status: DC | PRN
  Administered 2020-07-06 – 2020-07-08 (×8): 750 mg via ORAL
  Filled 2020-07-06 (×8): qty 2

## 2020-07-06 MED ORDER — TRAMADOL HCL 50 MG PO TABS
100.0000 mg | ORAL_TABLET | Freq: Four times a day (QID) | ORAL | Status: DC | PRN
  Administered 2020-07-06 – 2020-07-08 (×8): 100 mg via ORAL
  Filled 2020-07-06 (×8): qty 2

## 2020-07-06 MED ORDER — POLYETHYLENE GLYCOL 3350 17 G PO PACK
17.0000 g | PACK | Freq: Two times a day (BID) | ORAL | Status: DC
  Administered 2020-07-06 – 2020-07-08 (×5): 17 g via ORAL
  Filled 2020-07-06 (×5): qty 1

## 2020-07-06 MED ORDER — TRAMADOL HCL 50 MG PO TABS
50.0000 mg | ORAL_TABLET | Freq: Once | ORAL | Status: DC
  Filled 2020-07-06: qty 1

## 2020-07-06 MED ORDER — BISACODYL 10 MG RE SUPP
10.0000 mg | Freq: Once | RECTAL | Status: AC
  Administered 2020-07-06: 08:00:00 10 mg via RECTAL
  Filled 2020-07-06: qty 1

## 2020-07-06 MED ORDER — ENOXAPARIN SODIUM 30 MG/0.3ML ~~LOC~~ SOLN
30.0000 mg | SUBCUTANEOUS | Status: DC
  Administered 2020-07-06 – 2020-07-07 (×2): 30 mg via SUBCUTANEOUS
  Filled 2020-07-06 (×2): qty 0.3

## 2020-07-06 NOTE — TOC Initial Note (Signed)
Transition of Care [facility]) - Initial/Assessment Note    Patient Details  Name: Kristina Bean MRN: [id number] Date of Birth: 04/27/2001  [facility] CM/SW Contact:    Lia Hopping, [place] Phone Number: 07/06/2020, 5:22 PM  Clinical Narrative:                 CSW met with the patient at bedside, introduced role and reason for the visit. Patient agreeable to the visit.    CSW discussed physician recommendation for Outpatient physical therapy. Patient has requested to work with the physical therapy team again. Patient reports, during the last PT visit she felt too weak to walk however she reports having more strength today and was able to walk to the bathroom with the support of a walker and nurse supervision. Patient is agreeable to RW for home.   Patient express worries about not being able to afford her upcoming rent and utility bills. She reports since being at hospital she has not been able to work and her significant other not been able to work as many hours due to caring for their daughter. Patient inquired abut financial  assistance. CSW offered resources for [employer] and the [employer]. Patient receptive.   Patient reports she recently moved to the area about a year and is still learning the health care systems in the area. She made a appointment with a PCP appointment at a [place] clinic about a month ago however she missed it because she feeling sick. Patient agreeable to schedule another appointment. Patient has an appointment with a spine specialist tomorrow however she will miss it due to hospitalization. She plans to reschedule this appointment as well. She has an OBGYN appointment scheduled in January, she is hoping to make this appointment.    TOC will order a RW , arrange OPPT if patient is agreeable.    Expected Discharge Plan: Home/Self Care Barriers to Discharge: Continued Medical Work up   Patient Goals and CMS Choice Patient states their goals  for this hospitalization and ongoing recovery are:: find out what's going on      Expected Discharge Plan and Services Expected Discharge Plan: Home/Self Care In-house Referral: Clinical Social Work     Living arrangements for the past 2 months: Apartment                                      Prior Living Arrangements/Services Living arrangements for the past 2 months: Apartment Lives with:: Minor Children,Significant Other Patient language and need for interpreter reviewed:: No Do you feel safe going back to the place where you live?: Yes      Need for Family Participation in Patient Care: Yes (Comment) Care giver support system in place?: Yes (comment)   Criminal Activity/Legal Involvement Pertinent to Current Situation/Hospitalization: No - Comment as needed  Activities of Daily Living Home Assistive Devices/Equipment: None ADL Screening (condition at time of admission) Patient's cognitive ability adequate to safely complete daily activities?: Yes Is the patient deaf or have difficulty hearing?: No Does the patient have difficulty seeing, even when wearing glasses/contacts?: No Does the patient have difficulty concentrating, remembering, or making decisions?: No Patient able to express need for assistance with ADLs?: No Does the patient have difficulty dressing or bathing?: Yes Independently performs ADLs?: No (started about 2 weeks ago. Previously independent) Communication: Independent Dressing (OT): Needs assistance (started about 2 weeks ago) Is  this a change from baseline?: Pre-admission baseline Grooming: Needs assistance (started about 2 weeks ago) Is this a change from baseline?: Pre-admission baseline Feeding: Independent Bathing: Needs assistance (started about 2 weeks ago) Is this a change from baseline?: Pre-admission baseline Toileting: Needs assistance (started about 2 weeks ago) Is this a change from baseline?: Pre-admission baseline In/Out Bed:  Needs assistance (started about 2 weeks ago) Is this a change from baseline?: Pre-admission baseline Walks in Home: Needs assistance (started about 2 weeks ago) Is this a change from baseline?: Pre-admission baseline Does the patient have difficulty walking or climbing stairs?: Yes Weakness of Legs: Both Weakness of Arms/Hands: Both  Permission Sought/Granted                  Emotional Assessment Appearance:: Appears stated age Attitude/Demeanor/Rapport: Engaged Affect (typically observed): Accepting Orientation: : Oriented to Self,Oriented to Place,Oriented to  Time,Oriented to Situation Alcohol / Substance Use: Not Applicable Psych Involvement: No (comment)  Admission diagnosis:  Weakness [R53.1] Patient Active Problem List   Diagnosis Date Noted  . Constipation 07/06/2020  . Weakness 07/02/2020   PCP:  Patient, No Pcp Per Pharmacy:   [id number] [address], [address] AT [facility] OF [address] [address] Phone: [phone] Fax: [phone]     Social Determinants of Health ([place]) Interventions    Readmission Risk Interventions No flowsheet data found.

## 2020-07-06 NOTE — Progress Notes (Signed)
Chaplain engaged in follow-up visit with Noam.  During visit Kitana stated, "I am not sick because I'm depressed.  I'm depressed because I'm sick."  Kynzlee has been vocalizing that she knows something is wrong with her body and that this has nothing to do with her mind or being depressed.  She recognizes that there is an issue happening with her health.  Chamika expresses worry over not being as mobile and pain-free as she was in the past.  She can see that there has been a significant difference in the activities that she was able to engage in before versus what she is able to do now.   Saidy voiced wanting to be tested for rheumatoid arthritis.  She believes that there is something that can be named and explained in reference to what is happening to her body.  She has voiced wanting to know something before she discharges because of the fear she holds of going home and becoming progressively worse.  She is thinking deeply about what it means to go home not feeling well and how that will impact her ability to mother her two-year old daughter.    Tashunda also discussed how the health challenges she has experienced have significantly changed her plans in life.  She wanted to move back to [place] with her boyfriend to be closer to community and family that could offer more help to her and in raising her daughter.  She also has plans of enrolling back in school and starting a business.  She expressed how this time away and not knowing what is happening to her body is also putting a hold on her plans.    In reference to her pain and how she feels in the hospital, Nakya voiced feeling "numb."  She declared that the medicine does not really relieve the pain but rather it makes her so "spacey" and "lethargic" that it becomes hard to address it.  She conveys that the medicine she is on is just making her feel "out of it" but that she is fighting to remain aware so that she can advocate for herself.  Larene also has  felt quite embarrassed by needing to be cleaned up or helped.  It has been hard for her to adjust to needing others to do what she feels are simple tasks such as cleaning herself.  Chaplain and nurse have worked to normalize the help she is in need of and has been receiving.    Chaplain offered ministries of presence and listening with Rosi.  Chaplain was also able to provide her with a list of African American female therapists that take her insurance within the [place].  Chaplain will continue to follow-up and offer support.    07/06/20 1100  Clinical Encounter Type  Visited With Patient  Visit Type Follow-up;Psychological support;Spiritual support;Social support  Stress Factors  Patient Stress Factors Health changes;Exhausted;Major life changes

## 2020-07-06 NOTE — Progress Notes (Signed)
Patient complaints of pain vs elevated md aware, pain meds ordered x one dose, pt very anxious, sat with patient x 30 minutes. Vs repeated wnl. No other c/o at present

## 2020-07-06 NOTE — Plan of Care (Signed)
  Problem: Education: Goal: Knowledge of General Education information will improve Description Including pain rating scale, medication(s)/side effects and non-pharmacologic comfort measures Outcome: Progressing   

## 2020-07-06 NOTE — Progress Notes (Signed)
Sitting with patient at bedside, pt tearful stating she has had a horrible night with all the pain and it was so bad she almost passed out. Awaiting orders from md.
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
## 2020-07-06 NOTE — Progress Notes (Signed)
Continue to sit with patient, new orders rec'd and processed.

## 2020-07-07 DIAGNOSIS — E610 Copper deficiency: Secondary | ICD-10-CM

## 2020-07-07 LAB — IGG CSF INDEX
Albumin CSF-mCnc: 10 mg/dL (ref 7–29)
Albumin: 4.2 g/dL (ref 3.9–5.0)
CSF IgG Index: 0.5 (ref 0.0–0.7)
IgG (Immunoglobin G), Serum: 1424 mg/dL (ref 586–1602)
IgG, CSF: 1.6 mg/dL (ref 0.0–6.7)
IgG/Alb Ratio, CSF: 0.16 (ref 0.00–0.25)

## 2020-07-07 MED ORDER — METHYLPREDNISOLONE SODIUM SUCC 125 MG IJ SOLR
80.0000 mg | Freq: Once | INTRAMUSCULAR | Status: AC
  Administered 2020-07-07: 13:00:00 80 mg via INTRAVENOUS
  Filled 2020-07-07: qty 2

## 2020-07-07 MED ORDER — PREDNISONE 20 MG PO TABS
40.0000 mg | ORAL_TABLET | Freq: Every day | ORAL | Status: DC
  Administered 2020-07-08: 40 mg via ORAL
  Filled 2020-07-07: qty 2
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
## 2020-07-07 NOTE — Plan of Care (Signed)
  Problem: Health Behavior/Discharge Planning: Goal: Ability to manage health-related needs will improve Outcome: Progressing   Problem: Coping: Goal: Level of anxiety will decrease Outcome: Progressing   Problem: Elimination: Goal: Will not experience complications related to bowel motility Outcome: Progressing   Problem: Pain Managment: Goal: General experience of comfort will improve Outcome: Progressing   

## 2020-07-07 NOTE — Progress Notes (Signed)
Physical Therapy Treatment Patient Details Name: Kristina Bean MRN: [id number] DOB: 2000-10-19 Today's Date: 07/07/2020    History of Present Illness 19 year old female with history of asthma, bipolar 1 disorder, PTSD/anxiety/depression, scoliosis who presents to the emergency room with complaints of syncope and weakness on all extremities, generalized pain, tingling/burning sensation.  Neurology was consulted after admission.  She underwent extensive work-up with lumbar puncture, MRI of the thoracic, lumbar spine: results are inconclusive.  Psychiatry consultation also requested.    PT Comments    General Comments: Required MAX encouragement to participate present with multiple reasons General bed mobility comments: no physical assistance need just increased time General transfer comment: from bed with tendancy to pull up on walker.  VC's to push up from bed.  Required increased time as pt was waiting for Therapist to assist "can you pull me up?" I politely told her "No , I'm sorry.  But I am here just in case".  pt was able to self rise to a partially upright posture.  B hips and knees flex, trunk forward and B LE tight together (narrow BOS). General Gait Details: Required MAX encouragement to walk "I'm sleepy" and "I just got my steriod injection".  Pt started off slow with very small steps present with flex posture throughout but with increased distance was able to elongate her stride.  Still present with flex hips/knees with excessive lean on walker.  Pt c/o pain "in all joints".  Then c/o B UE "spasms" from using walker "to support myself".  Pt amb 65 feet but it took her 8 min to complete. Assisted back to bed per pt request.   Pt plans to return home with sig other and 19 year old  Follow Up Recommendations  Home health PT     Equipment Recommendations  Rolling walker with 5" wheels    Recommendations for Other Services       Precautions / Restrictions Precautions Precautions:  Fall Restrictions Weight Bearing Restrictions: No    Mobility  Bed Mobility Overal bed mobility: Modified Independent             General bed mobility comments: no physical assistance need just increased time  Transfers Overall transfer level: Needs assistance Equipment used: None Transfers: Sit to/from [employer] Sit to Stand: Min assist Stand pivot transfers: Min assist       General transfer comment: from bed with tendancy to pull up on walker.  VC's to push up from bed.  Required increased time as pt was waiting for Therapist to assist "can you pull me up?" I politely told her "No , I'm sorry.  But I am here just in case".  pt was able to self rise to a partially upright posture.  B hips and knees flex, trunk forward and B LE tight together (narrow BOS).  Ambulation/Gait Ambulation/Gait assistance: Supervision Gait Distance (Feet): 65 Feet Assistive device: Rolling walker (2 wheeled) Gait Pattern/deviations: Step-through pattern;Narrow base of support;Trunk flexed Gait velocity: decreased x 2   General Gait Details: Required MAX encouragement to walk "I'm sleepy" and "I just got my steriod injection".  Pt started off slow with very small steps present with flex posture throughout but with increased distance was able to elongate her stride.  Still present with flex hips/knees with excessive lean on walker.  Pt c/o pain "in all joints".  Then c/o B UE "spasms" from using walker "to support myself".  Pt amb 65 feet but it took her 8 min to complete.  Stairs             Wheelchair Mobility    Modified Rankin (Stroke Patients Only)       Balance                                            Cognition Arousal/Alertness: Awake/alert Behavior During Therapy: Flat affect                                   General Comments: Required MAX encouragement to participate present with multiple reasons      Exercises       General Comments        Pertinent Vitals/Pain Pain Assessment: Faces Faces Pain Scale: Hurts even more Pain Location: B LE, B UE, HA, back, neck Pain Descriptors / Indicators: Grimacing;Moaning Pain Intervention(s): Monitored during session    Home Living                      Prior Function            PT Goals (current goals can now be found in the care plan section) Progress towards PT goals: Progressing toward goals    Frequency    Min 3X/week      PT Plan Current plan remains appropriate    Co-evaluation              AM-PAC PT "6 Clicks" Mobility   Outcome Measure  Help needed turning from your back to your side while in a flat bed without using bedrails?: None Help needed moving from lying on your back to sitting on the side of a flat bed without using bedrails?: None Help needed moving to and from a bed to a chair (including a wheelchair)?: A Little Help needed standing up from a chair using your arms (e.g., wheelchair or bedside chair)?: A Little Help needed to walk in hospital room?: A Little Help needed climbing 3-5 steps with a railing? : A Little 6 Click Score: 20    End of Session Equipment Utilized During Treatment: Gait belt Activity Tolerance: Patient tolerated treatment well Patient left: in bed;with bed alarm set;with call bell/phone within reach Nurse Communication: Mobility status PT Visit Diagnosis: Other abnormalities of gait and mobility (R26.89)     Time: 13:50 - 14:04 PT Time Calculation (min) (ACUTE ONLY): 32 min  Charges:  $Gait Training: 8-22 mins                     Felecia Shelling  PTA Acute  Rehabilitation Services Pager      [phone] Office      [phone]

## 2020-07-07 NOTE — Progress Notes (Signed)
Chaplain engaged in follow-up visit with Kristina Bean.  Kristina Bean vocalized feeling better and chaplain affirmed that she looks much better.  Kristina Bean noted that she was finally able to sleep at least four hours last night due to a steroid shot that was prescribed by her new doctor.  Kristina Bean verbalized that she appreciates having her current physician who seems to have been able to create a concrete plan for her and has offered her more clarity.  Kristina Bean has also been thankful for her nurse, Selena Batten and others, who have offered the nurturing that she has needed throughout her time alone in the hospital.   Chaplain and Ada also discussed the great amount of self-awareness she seems to have obtained throughout her time in the hospital.  Chaplain affirmed Kristina Bean's ability to advocate for herself and how she has found her voice.  Kristina Bean also recognizes now the importance of a routine for herself that includes daily hygiene.  Taking a shower while in the being in the hospital is important to Kristina Bean.  She connects being clean with mental clarity.  Also, being able to put on her own shirt instead of the hospital gown has left Kristina Bean feeling more comfortable.  Chaplain can assess Kristina Bean's need for routine and creating a space that allows her to feel less anxious.  She also desires to have a level of cleanliness concerning her body to be able to solely focus on what is happening with her health.  Chaplain noted that if she is in the hospital again, she can now verbalize what she needs to be and feel well.    Kristina Bean also noted that speaking with her daughter today brought her a lot of joy.  She calls her daughter "her shadow."  She expressed how great it was to see her smiling and upbeat today.    Chaplain offered ministries of presence, listening and prayer with Kristina Bean.  Chaplain provided her with a printout of the Psalm 23, which Kristina Bean finds great connection to through her family.  Chaplain also provided Kristina Bean with another copy  of a list of Black female therapists that take her insurance.  Chaplain continues to offer support to Kristina Bean concerning getting tasks done such as calling her job and letting them know about her current hospitalization or setting up an outpatient visit with a therapist.      07/07/20 0730  Clinical Encounter Type  Visited With Patient  Visit Type Follow-up  Advance Directives (For Healthcare)  Does Patient Have a Medical Advance Directive? No  Would patient like information on creating a medical advance directive? No - Patient declined  Mental Health Advance Directives  Does Patient Have a Mental Health Advance Directive? No  Would patient like information on creating a mental health advance directive? No - Patient declined

## 2020-07-08 LAB — CBC
HCT: 39.5 % (ref 36.0–46.0)
Hemoglobin: 13.2 g/dL (ref 12.0–15.0)
MCH: 31.7 pg (ref 26.0–34.0)
MCHC: 33.4 g/dL (ref 30.0–36.0)
MCV: 94.7 fL (ref 80.0–100.0)
Platelets: 291 10*3/uL (ref 150–400)
RBC: 4.17 MIL/uL (ref 3.87–5.11)
RDW: 11.7 % (ref 11.5–15.5)
WBC: 8 10*3/uL (ref 4.0–10.5)
nRBC: 0 % (ref 0.0–0.2)

## 2020-07-08 LAB — COMPREHENSIVE METABOLIC PANEL
ALT: 17 U/L (ref 0–44)
AST: 20 U/L (ref 15–41)
Albumin: 3.8 g/dL (ref 3.5–5.0)
Alkaline Phosphatase: 30 U/L — ABNORMAL LOW (ref 38–126)
Anion gap: 9 (ref 5–15)
BUN: 23 mg/dL — ABNORMAL HIGH (ref 6–20)
CO2: 24 mmol/L (ref 22–32)
Calcium: 9.1 mg/dL (ref 8.9–10.3)
Chloride: 104 mmol/L (ref 98–111)
Creatinine, Ser: 0.69 mg/dL (ref 0.44–1.00)
GFR, Estimated: >60 mL/min (ref >60)
Glucose, Bld: 147 mg/dL — ABNORMAL HIGH (ref 70–99)
Potassium: 3.9 mmol/L (ref 3.5–5.1)
Sodium: 137 mmol/L (ref 135–145)
Total Bilirubin: 0.6 mg/dL (ref 0.3–1.2)
Total Protein: 7.1 g/dL (ref 6.5–8.1)

## 2020-07-08 LAB — CULTURE, BLOOD (ROUTINE X 2)
Culture: NO GROWTH
Culture: NO GROWTH
Special Requests: ADEQUATE

## 2020-07-08 LAB — MAGNESIUM: Magnesium: 2.2 mg/dL (ref 1.7–2.4)

## 2020-07-08 LAB — PHOSPHORUS: Phosphorus: 2.9 mg/dL (ref 2.5–4.6)

## 2020-07-08 MED ORDER — DULOXETINE HCL 60 MG PO CPEP
60.0000 mg | ORAL_CAPSULE | Freq: Every day | ORAL | 1 refills | Status: AC
Start: 2020-07-09 — End: ?

## 2020-07-08 MED ORDER — PREDNISONE 20 MG PO TABS: 40.0000 mg | ORAL_TABLET | Freq: Every day | ORAL | 0 refills | Status: AC

## 2020-07-08 MED ORDER — TAMSULOSIN HCL 0.4 MG PO CAPS: 0.4000 mg | ORAL_CAPSULE | Freq: Every day | ORAL | 0 refills | Status: AC

## 2020-07-08 MED ORDER — TRAMADOL HCL 50 MG PO TABS: 100.0000 mg | ORAL_TABLET | Freq: Four times a day (QID) | ORAL | 0 refills | Status: AC | PRN

## 2020-07-08 MED ORDER — HYDROXYZINE HCL 10 MG PO TABS: 10.0000 mg | ORAL_TABLET | Freq: Three times a day (TID) | ORAL | 0 refills | Status: AC | PRN

## 2020-07-08 MED ORDER — ADULT MULTIVITAMIN W/MINERALS CH: 1.0000 | ORAL_TABLET | Freq: Every day | ORAL | Status: AC

## 2020-07-08 MED ORDER — VITAMIN D (ERGOCALCIFEROL) 1.25 MG (50000 UNIT) PO CAPS: 50000.0000 [IU] | ORAL_CAPSULE | ORAL | 0 refills | Status: AC

## 2020-07-08 MED ORDER — POLYETHYLENE GLYCOL 3350 17 G PO PACK: 17.0000 g | PACK | Freq: Two times a day (BID) | ORAL | 1 refills | Status: AC

## 2020-07-08 MED ORDER — SENNOSIDES-DOCUSATE SODIUM 8.6-50 MG PO TABS: 1.0000 | ORAL_TABLET | Freq: Two times a day (BID) | ORAL | 0 refills | Status: AC

## 2020-07-08 MED ORDER — ACETAMINOPHEN 500 MG PO TABS: 1000.0000 mg | ORAL_TABLET | Freq: Four times a day (QID) | ORAL | 0 refills | Status: AC | PRN

## 2020-07-08 NOTE — Discharge Summary (Signed)
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 result is Negative.  Fact Sheet for Patients:  BloggerCourse.comhttps://www.fda.gov/media/152166/download  Fact Sheet for Healthcare Providers:  SeriousBroker.ithttps://www.fda.gov/media/152162/download  This test is no t yet approved or cleared by the [place] FDA and  has been authorized for detection and/or diagnosis of SARS-CoV-2 by FDA under an Emergency Use Authorization (EUA). This EUA will remain  in effect (meaning this test can be used) for the duration of the COVID-19 declaration under Section 564(b)(1) of the Act, 21 U.S.C.section 360bbb-3(b)(1), unless the authorization is terminated  or revoked sooner.       Influenza A by PCR NEGATIVE NEGATIVE Final   Influenza B by PCR NEGATIVE NEGATIVE Final    Comment: (NOTE) The Xpert Xpress SARS-CoV-2/FLU/RSV plus assay is intended as an aid in the diagnosis of influenza  from Nasopharyngeal swab specimens and should not be used as a sole basis for treatment. Nasal washings and aspirates are unacceptable for Xpert Xpress SARS-CoV-2/FLU/RSV testing.  Fact Sheet for Patients: BloggerCourse.comhttps://www.fda.gov/media/152166/download  Fact Sheet for Healthcare Providers: SeriousBroker.ithttps://www.fda.gov/media/152162/download  This test is not yet approved or cleared by the [place] FDA and has been authorized for detection and/or diagnosis of SARS-CoV-2 by FDA under an Emergency Use Authorization (EUA). This EUA will remain in effect (meaning this test can be used) for the duration of the COVID-19 declaration under Section 564(b)(1) of the Act, 21 U.S.C. section 360bbb-3(b)(1), unless the authorization is terminated or revoked.  Performed at [facility], [address] [place], [place] [id number]   Culture, blood (  routine x 2)     Status: None   Collection Time: 07/03/20  3:01 AM   Specimen: BLOOD  Result Value Ref Range Status   Specimen Description   Final    BLOOD RIGHT ANTECUBITAL Performed at [facility], [address] [place], [place] [address]    Special Requests   Final    BOTTLES DRAWN AEROBIC ONLY Blood Culture results may not be optimal due to an inadequate volume of blood received in culture bottles Performed at [facility], [address] [place], [place] [address]    Culture   Final    NO GROWTH 5 DAYS Performed at [facility], [address] [place], [place] [address]    Report Status 07/08/2020 FINAL  Final  Culture, blood (routine x 2)     Status: None   Collection Time: 07/03/20  3:01 AM   Specimen: BLOOD  Result Value Ref Range Status   Specimen Description   Final    BLOOD RIGHT HAND Performed at [facility], [address], [place], [place] [address]    Special Requests   Final    BOTTLES DRAWN AEROBIC ONLY Blood Culture adequate volume Performed at  [facility], [address] [place], [place] [address]    Culture   Final    NO GROWTH 5 DAYS Performed at [facility], [address], [place], [place] [address]    Report Status 07/08/2020 FINAL  Final  Urine culture     Status: None   Collection Time: 07/03/20  7:00 AM   Specimen: Urine, Random  Result Value Ref Range Status   Specimen Description   Final    URINE, RANDOM Performed at [facility], [address] [place], [place] [address]    Special Requests   Final    NONE Performed at [facility], [address] [place], [place] [address]    Culture   Final    NO GROWTH Performed at [facility], [address], [place], [place] [address]    Report Status 07/04/2020 FINAL  Final  CSF culture     Status: None   Collection Time: 07/03/20 11:05 AM   Specimen: PATH Cytology CSF; Cerebrospinal Fluid  Result Value Ref Range Status   Specimen Description   Final    CSF Performed at [facility], [address] [place], [place] [address]    Special Requests   Final    NONE Performed at [facility], [address], [place], [place] [address]    Gram Stain NO WBC SEEN NO ORGANISMS SEEN CYTOSPIN SMEAR   Final   Culture   Final    NO GROWTH 3 DAYS Performed at [facility], [address], [place], [place] [address]    Report Status 07/06/2020 FINAL  Final     Labs: Basic Metabolic Panel: Recent Labs  Lab 07/02/20 1410 07/03/20 0301 07/04/20 0644 07/06/20 1349 07/08/20 0306  NA 138 137 138 135 137  K 3.4* 3.1* 4.0 4.1 3.9  CL 104 103 102 100 104  CO2 24 22 26 26 24   GLUCOSE 82 105* 87 90 147*  BUN 26* 27* 19 16 23*  CREATININE 0.95 0.94 0.95 0.92 0.69  CALCIUM 9.3 9.0 8.9 9.6 9.1  MG  --  2.1  --  2.1 2.2  PHOS  --  3.8  --  3.6 2.9   Liver Function Tests: Recent Labs  Lab 07/02/20 1410 07/03/20 1105 07/06/20 1349 07/08/20 0306   AST 13*  --   --  20  ALT 13  --   --  17  ALKPHOS 34*  --   --  30*  BILITOT 1.5*  --   --  0.6  PROT 7.7  --   --  7.1  ALBUMIN 4.3 4.2 4.1 3.8   No results for input(s): LIPASE, AMYLASE in the last 168 hours. No results for input(s): AMMONIA in the last 168 hours. CBC: Recent Labs  Lab 07/02/20 1410 07/03/20 0301 07/06/20 1349 07/08/20 0306  WBC 8.8 7.8 5.3 8.0  NEUTROABS 6.1  --  2.8  --   HGB 13.9 13.7 15.0 13.2  HCT 40.4 40.6 43.8 39.5  MCV 92.2 93.3 94.0 94.7  PLT 295 284 298 291   Cardiac Enzymes: Recent Labs  Lab 07/02/20 1410  CKTOTAL 202   BNP: BNP (last 3 results) No results for input(s): BNP in the last 8760 hours.  ProBNP (last 3 results) No results for input(s): PROBNP in the last 8760 hours.  CBG: Recent Labs  Lab 07/06/20 0029  GLUCAP 105*       Signed:  Ramiro Harvest MD.  [facility] 07/08/2020, 4:04 PM

## 2020-07-08 NOTE — Progress Notes (Signed)
Occupational Therapy Treatment Patient Details Name: Kristina Bean MRN: [id number] DOB: February 10, 2001 Today's Date: 07/08/2020    History of present illness 19 year old female with history of asthma, bipolar 1 disorder, PTSD/anxiety/depression, scoliosis who presents to the emergency room with complaints of syncope and weakness on all extremities, generalized pain, tingling/burning sensation.  Neurology was consulted after admission.  She underwent extensive work-up with lumbar puncture, MRI of the thoracic, lumbar spine: results are inconclusive.  Psychiatry consultation also requested.   OT comments  Pt. Seen for skilled OT treatment session.  Able to complete peri care seated on bsc.  Sit/stands and Short distance ambulation from bsc to recliner with min encouragement. Cues for hand placement and rw management.  Responded well with cues. Noted decreased time for task completion from previous days with PT/OT.    Follow Up Recommendations  Home health OT;Supervision/Assistance - 24 hour    Equipment Recommendations  3 in 1 bedside commode    Recommendations for Other Services      Precautions / Restrictions Precautions Precautions: Fall Restrictions Weight Bearing Restrictions: No       Mobility Bed Mobility                  Transfers Overall transfer level: Needs assistance Equipment used: Rolling walker (2 wheeled) Transfers: Sit to/from Stand;Stand Pivot Transfers Sit to Stand: Min guard Stand pivot transfers: Min guard       General transfer comment: cues for hand placement on arm rests for sitting and stand to sit.  cues for upright posture to ease burden of weight on legs. responded well to cues today    Balance                                           ADL either performed or assessed with clinical judgement   ADL Overall ADL's : Needs assistance/impaired                         Toilet Transfer: Min Emergency planning/management officer  Details (indicate cue type and reason): received on BSC, able to sit/stand and amb. to recliner including full pivot prior to sitting down Toileting- Clothing Manipulation and Hygiene: Set up;Sitting/lateral lean         General ADL Comments: less encouragement and cues required for participation than previously doc. this day. pt. receptive to cues for posture to aide in reducing weight in legs and arms     Vision       Perception     Praxis      Cognition Arousal/Alertness: Awake/alert Behavior During Therapy: Flat affect Overall Cognitive Status: Within Functional Limits for tasks assessed                                          Exercises     Shoulder Instructions       General Comments      Pertinent Vitals/ Pain       Pain Assessment: Faces Faces Pain Scale: Hurts a little bit Pain Location: grimacing with transtional movements Pain Descriptors / Indicators: Grimacing;Moaning Pain Intervention(s): Limited activity within patient's tolerance;Monitored during session;Repositioned  Home Living  Prior Functioning/Environment              Frequency  Min 2X/week        Progress Toward Goals  OT Goals(current goals can now be found in the care plan section)  Progress towards OT goals: Progressing toward goals     Plan      Co-evaluation                 AM-PAC OT "6 Clicks" Daily Activity     Outcome Measure   Help from another person eating meals?: None Help from another person taking care of personal grooming?: None Help from another person toileting, which includes using toliet, bedpan, or urinal?: A Lot Help from another person bathing (including washing, rinsing, drying)?: A Lot Help from another person to put on and taking off regular upper body clothing?: A Little Help from another person to put on and taking off regular lower body clothing?: A Lot 6 Click  Score: 17    End of Session Equipment Utilized During Treatment: Rolling walker  OT Visit Diagnosis: Unsteadiness on feet (R26.81);Other abnormalities of gait and mobility (R26.89);Pain;Other symptoms and signs involving the nervous system (R29.898)   Activity Tolerance Patient tolerated treatment well   Patient Left in chair;with call bell/phone within reach;with chair alarm set   Nurse Communication          Time: [phone] OT Time Calculation (min): 11 min  Charges: OT General Charges $OT Visit: 1 Visit OT Treatments $Self Care/Home Management : 8-22 mins  Boneta Lucks, COTA/L Acute Rehabilitation [phone]   Robet Leu 07/08/2020, 11:46 AM

## 2020-07-08 NOTE — TOC Transition Note (Signed)
Transition of Care [facility]) - CM/SW Discharge Note   Patient Details  Name: Kristina Bean MRN: [id number] Date of Birth: [birth year]  [facility] CM/SW Contact:  Clearance Coots, LCSW Phone Number: 07/08/2020, 10:29 AM   Clinical Narrative:    CSW assisted patient with follow up appointments. Patient understands she is to call and schedule the appointment at the locations that were closed in observance of the holiday. Information on AVS.  Patient declines HHPT, an OPPT referral made.  RW ordered through [employer] and delivered to the patient bedside.  Patient given packet of financial resources and [facility]. No other needs identified.    Final next level of care: Home/Self Care Barriers to Discharge: Barriers Resolved   Patient Goals and CMS Choice Patient states their goals for this hospitalization and ongoing recovery are:: find out what's going on      Discharge Placement                       Discharge Plan and Services In-house Referral: Clinical Social Work              DME Arranged: Dan Humphreys rolling DME Agency: [employer] Date DME Agency Contacted: 07/08/20 Time DME Agency Contacted: 0900 Representative spoke with at DME Agency: Velna Hatchet            Social Determinants of Health (SDOH) Interventions     Readmission Risk Interventions No flowsheet data found.

## 2020-07-08 NOTE — Plan of Care (Signed)

## 2020-07-11 LAB — COPPER, URINE - RANDOM OR 24 HOUR
Copper / Creatinine Ratio: 8 ug/g creat (ref 0–49)
Copper, 24H Ur: 12 ug/24 hr (ref 3–35)
Copper, Ur: 10 ug/L
Creatinine(Crt),U: 1.31 g/L (ref 0.30–3.00)
Total Volume: 1225

## 2020-07-11 LAB — OLIGOCLONAL BANDS, CSF + SERM

## 2020-07-13 ENCOUNTER — Telehealth: Admitting: Family

## 2020-07-13 DIAGNOSIS — M546 Pain in thoracic spine: Secondary | ICD-10-CM

## 2020-07-13 NOTE — Progress Notes (Signed)
Based on what you shared with me, I feel your condition warrants further evaluation and I recommend that you be seen for a face to face office visit.  Given your current symptoms, you need to be seen face to face to be evaluated.    NOTE: If you entered your credit card information for this eVisit, you will not be charged. You may see a "hold" on your card for the $35 but that hold will drop off and you will not have a charge processed.   If you are having a true medical emergency please call 911.      For an urgent face to face visit, [place] has five urgent care centers for your convenience:     [facility] [place] Get Driving Directions [phone] [address] [place], [place] [address] . 10 am - 6pm Monday - Friday    [facility] Get Driving Directions [phone] [address] [place], [place] [address] . 10 am to 8 pm Monday-Friday . 12 pm to 8 pm Saturday-Sunday     [facility] Get Driving Directions [phone] [address] [place], [place] [address] . 8 am to 8 pm Monday-Friday . 9 am to 6 pm Saturday . 11 am to 6 pm Sunday     [facility] [place] Get Driving Directions  [phone] [address] [place], [place] [address] . 8 am to 8 pm Monday-Friday . 8 am to 4 pm Saturday-Sunday    [facility] Get Driving Directions [phone] [address] [place], [place] [address] . 12 pm to 6 pm Monday-Friday      Your e-visit answers were reviewed by a board certified advanced clinical practitioner to complete your personal care plan.  Thank you for using e-Visits.     

## 2020-08-16 LAB — ARBOVIRUS PANEL, ~~LOC~~ LAB

## 2021-12-10 IMAGING — MR MR THORACIC SPINE WO/W CM
7 of 12 series · 18 of 48 positions shown · IV contrast (Gadavist)
Comparison: None.

CLINICAL DATA: Demyelinating disease. Spinal stenosis. Lumbosacral
back pain. Cauda equina syndrome suspected.

EXAM:
MRI THORACIC AND LUMBAR SPINE WITHOUT AND WITH CONTRAST
TECHNIQUE: Multiplanar and multiecho pulse sequences of the thoracic and lumbar
spine were obtained without and with intravenous contrast.
CONTRAST:  7mL GADAVIST GADOBUTROL 1 MMOL/ML IV SOLN

[Series 16: T1 · sagittal · 3.0mm · 0.62mm/px · 2 of 17 slices shown (1 of 5)]
[im 1/17]
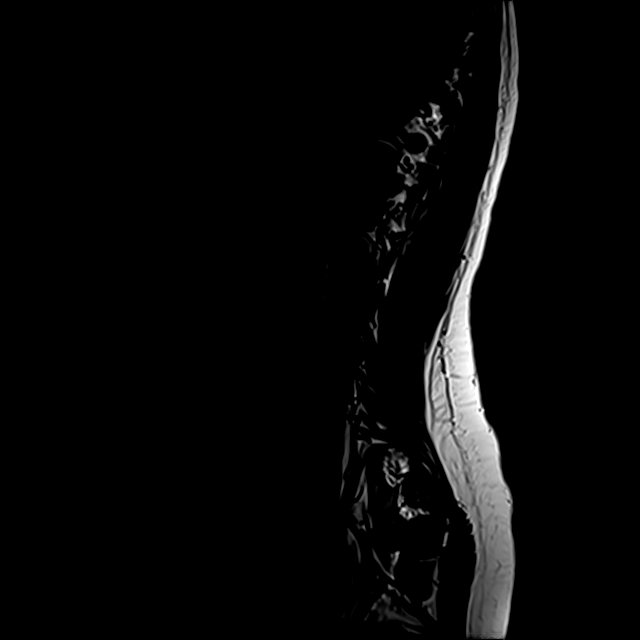
[im 17/17]
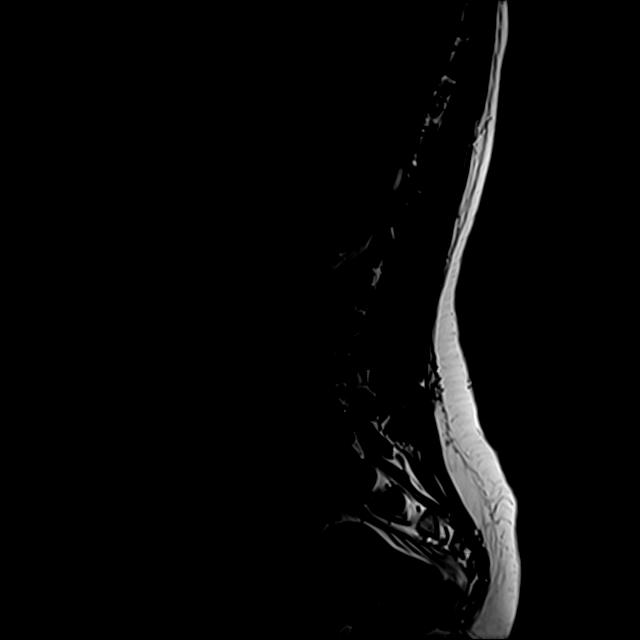

[Series 17: T1 · sagittal · 3.0mm · 0.62mm/px · 2 of 17 slices shown (2 of 5)]
[im 1/17]
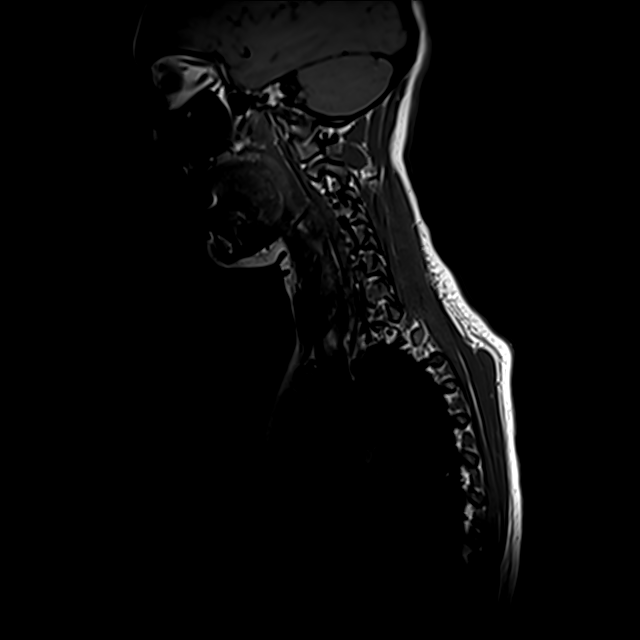
[im 17/17]
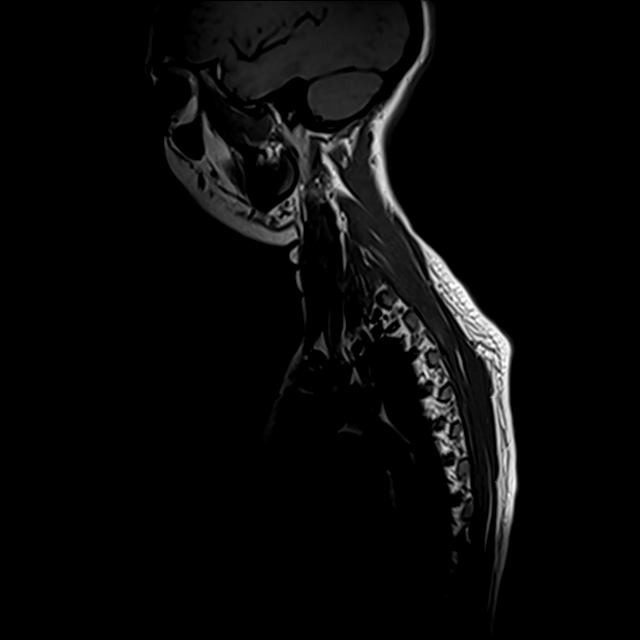

[Series 18: T1 · sagittal · 3.3mm · 0.62mm/px · 2 of 17 slices shown (3 of 5)]
[im 1/17]
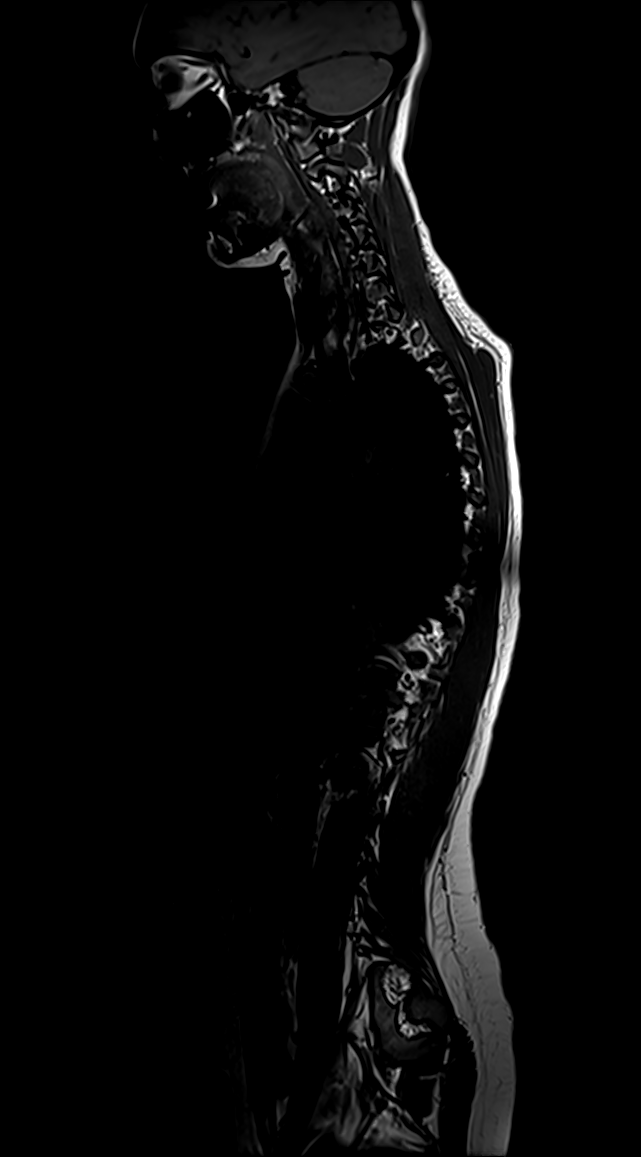
[im 17/17]
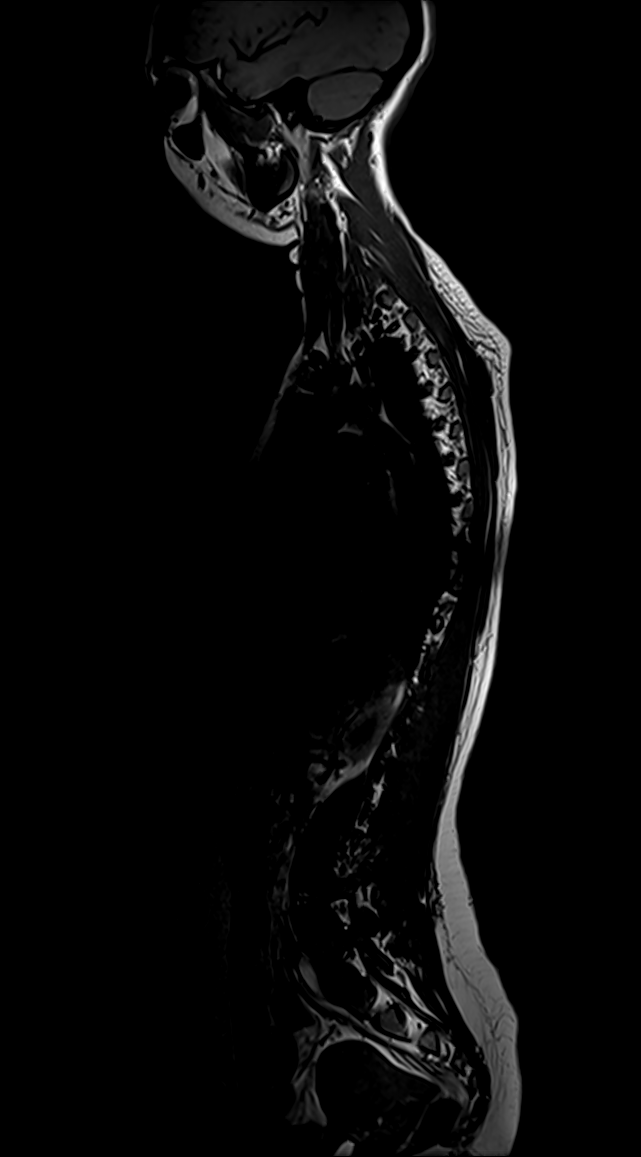

[Series 19: T2 · sagittal · 3.0mm · 0.67mm/px · 2 of 17 slices shown (1 of 2)]
[im 1/17]
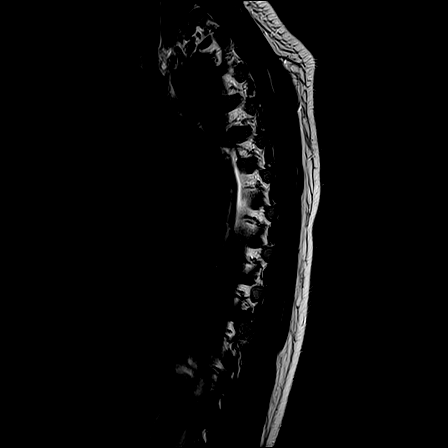
[im 17/17]
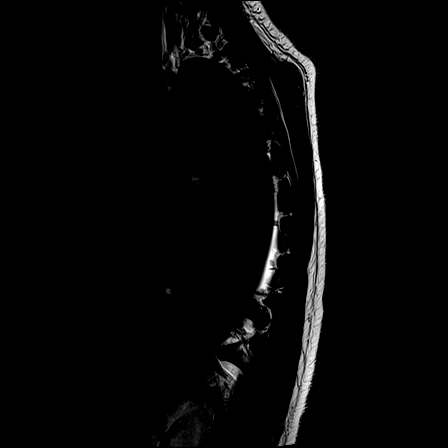

[Series 20: T1 · sagittal · 3.0mm · 0.67mm/px · 2 of 17 slices shown (4 of 5)]
[im 1/17]
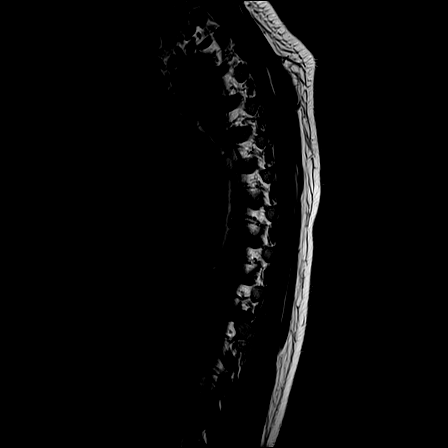
[im 17/17]
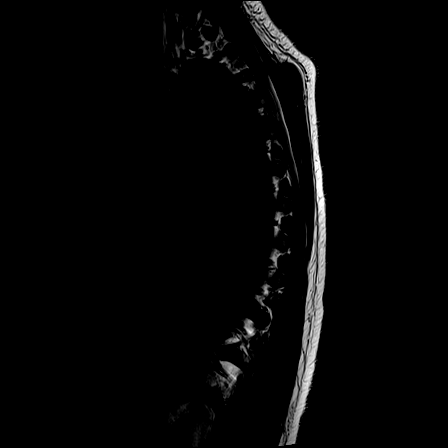

[Series 22: T2 · axial · 5.0mm · 0.59mm/px · z∈[-154,+56]mm · 5 of 39 slices shown (2 of 2)]
[im 1/39]
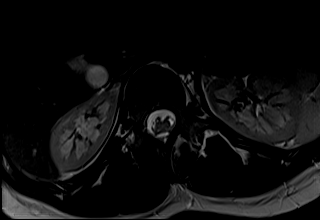
[im 10/39]
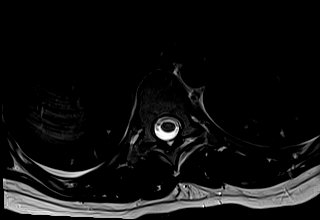
[im 20/39]
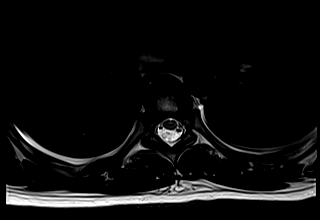
[im 29/39]
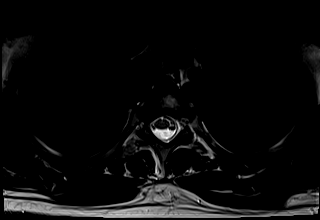
[im 39/39]
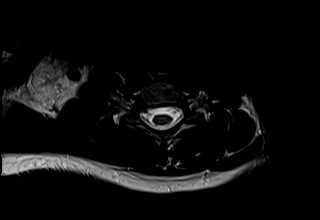

[Series 24: T1 · axial · non-contrast · 5.0mm · 0.31mm/px · z∈[-145,-18]mm · 3 of 39 slices shown (5 of 5)]
[im 1/39]
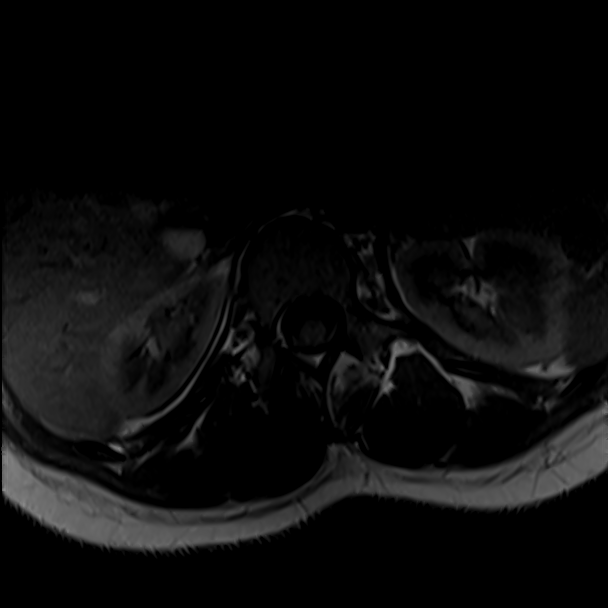
[im 10/39]
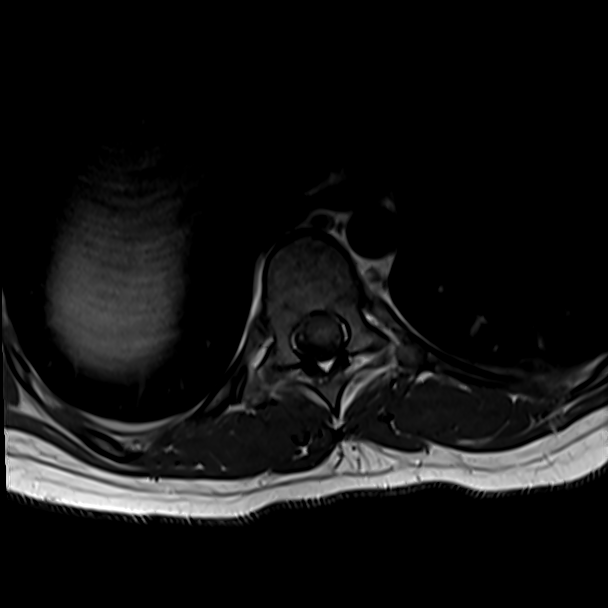
[im 20/39]
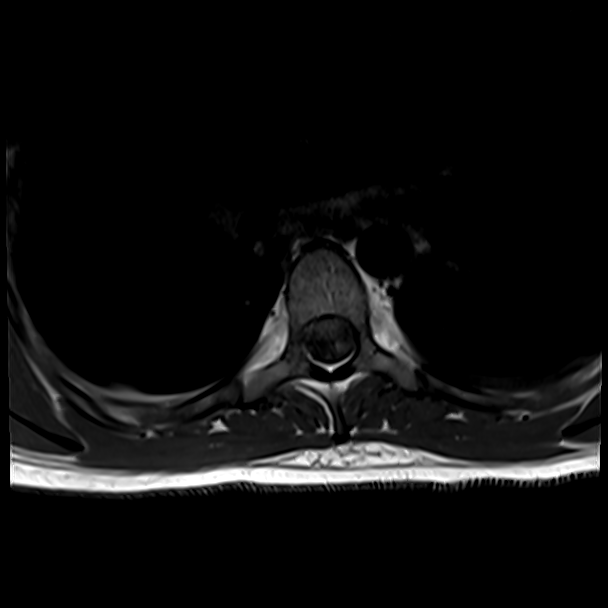

[18 of 48 positions shown; findings below may reference images not displayed]

FINDINGS: MRI THORACIC SPINE FINDINGS

Alignment: S-shaped scoliosis of the thoracolumbar spine with
thoracic levocurvature.

Vertebrae: No fracture, evidence of discitis, or bone lesion.

Cord:  Normal signal and morphology.

Paraspinal and other soft tissues: Negative.

Disc levels:

Well preserved disc height and hydration. No facet spurring. No
neural impingement

MRI LUMBAR SPINE FINDINGS

Segmentation:  5 lumbar type vertebrae

Alignment:  Upper lumbar and lower thoracic dextrocurvature.

Vertebrae:  No fracture, evidence of discitis, or bone lesion.

Conus medullaris: Extends to the L1-2 level and appears normal. No
abnormal intrathecal enhancement or cauda equina thickening.

Paraspinal and other soft tissues: Negative

Disc levels:

Generally preserved disc height and hydration. Negative for facet
spurring or neural impingement.
IMPRESSION: 1. Normal appearance of the spinal cord and cauda equina.
2. Thoracolumbar scoliosis.
3. No neural impingement or visible inflammation.

## 2022-05-18 LAB — METHYLMALONIC ACID, SERUM: Methylmalonic Acid, Quantitative: 89 nmol/L (ref 0–378)
